# Patient Record
Sex: Female | Born: 1935 | Race: Black or African American | Hispanic: No | Smoking: Never smoker
Health system: Southern US, Community
[De-identification: ages and names within clinical notes are randomized; demographics above are authoritative.]

## PROBLEM LIST (undated history)

## (undated) DIAGNOSIS — I10 Essential (primary) hypertension: Secondary | ICD-10-CM

---

## 2019-07-18 ENCOUNTER — Emergency Department (HOSPITAL_COMMUNITY)
Admission: EM | Admit: 2019-07-18 | Discharge: 2019-07-18 | Disposition: A | Discharge status: Home / Self Care | Payer: Medicare HMO | Attending: Emergency Medicine | Admitting: Emergency Medicine

## 2019-07-18 ENCOUNTER — Encounter (HOSPITAL_COMMUNITY): Payer: Self-pay | Admitting: Emergency Medicine

## 2019-07-18 ENCOUNTER — Other Ambulatory Visit: Payer: Self-pay

## 2019-07-18 ENCOUNTER — Emergency Department (HOSPITAL_COMMUNITY): Payer: Medicare HMO

## 2019-07-18 DIAGNOSIS — M7989 Other specified soft tissue disorders: Secondary | ICD-10-CM | POA: Diagnosis not present

## 2019-07-18 DIAGNOSIS — M79672 Pain in left foot: Secondary | ICD-10-CM | POA: Diagnosis not present

## 2019-07-18 DIAGNOSIS — I10 Essential (primary) hypertension: Secondary | ICD-10-CM | POA: Insufficient documentation

## 2019-07-18 HISTORY — DX: Essential (primary) hypertension: I10

## 2019-07-18 MED ORDER — ACETAMINOPHEN 500 MG PO TABS
1000.0000 mg | ORAL_TABLET | Freq: Once | ORAL | Status: AC
  Administered 2019-07-18: 1000 mg via ORAL
  Filled 2019-07-18: qty 2

## 2019-07-18 MED ORDER — ACETAMINOPHEN 500 MG PO TABS: 1000.0000 mg | ORAL_TABLET | Freq: Four times a day (QID) | ORAL | 0 refills | Status: DC | PRN

## 2019-07-18 NOTE — ED Notes (Signed)
Patient verbalized understanding of discharge instructions. Opportunity for questions were provided. Pt. ambulatory and discharged home.  

## 2019-07-18 NOTE — ED Provider Notes (Signed)
[facility] Provider Note   CSN: [id number] Arrival date & time: 07/18/19  0104    History   Chief Complaint Chief Complaint  Patient presents with  . Foot Pain  . difficulty ambulating    HPI Mary Chavez is a 85 y.o. female with a hx of HTN, GERD presents to the Emergency Department complaining of gradual, persistent, progressively worsening left foot pain onset 3 days ago. She reports pain is worsened with standing and walking.  Patient reports using a walker at home however states it is still difficult to walk due to pain.  She has taken no medications for this.  She does report a history of gout which occurs in the left great toe however symptoms today are different.  Pt reports she fell several months ago and injured the foot, but did not break it.  She [place] being [place] in [place] foot, but she does not know what was the diagnosis was.  Patient denies swelling in her foot or leg.  Calf tenderness, knee or hip pain.  Denies fever, chills, numbness, tingling or weakness.  Patient's daughter at bedside assists with some of the history.  Patient is new to the area and has primary care appointment set up for July 26, 2019.      The history is provided by the patient, a relative and medical records. No language interpreter was used.    Past Medical History:  Diagnosis Date  . Hypertension     There are no active problems to display for this patient.   History reviewed. No pertinent surgical history.   OB History   No obstetric history on file.      Home Medications    Prior to Admission medications   Medication Sig Start Date End Date Taking? Authorizing Provider  acetaminophen (TYLENOL) 500 MG tablet Take 2 tablets (1,000 mg total) by mouth every 6 (six) hours as needed for moderate pain. 07/18/19   Samhita Kretsch, Dahlia ClientHannah, PA-C    Family History No family history on file.  Social History Social History    Tobacco Use  . Smoking status: Never Smoker  Substance Use Topics  . Alcohol use: Not Currently  . Drug use: Not Currently     Allergies   Penicillins   Review of Systems Review of Systems  Constitutional: Negative for chills and fever.  Gastrointestinal: Negative for nausea and vomiting.  Musculoskeletal: Positive for arthralgias. Negative for back pain, joint swelling, neck pain and neck stiffness.  Skin: Negative for wound.  Neurological: Negative for numbness.  Hematological: Does not bruise/bleed easily.  Psychiatric/Behavioral: The patient is not nervous/anxious.   All other systems reviewed and are negative.    Physical Exam Updated Vital Signs BP (!) 141/60 (BP Location: Right Arm)   Pulse 84   Temp 98.3 F (36.8 C)   Resp 17   SpO2 100%   Physical Exam Vitals signs and nursing note reviewed.  Constitutional:      General: She is not in acute distress.    Appearance: She is well-developed. She is not diaphoretic.  HENT:     Head: Normocephalic and atraumatic.  Eyes:     Conjunctiva/sclera: Conjunctivae normal.  Neck:     Musculoskeletal: Normal range of motion.  Cardiovascular:     Rate and Rhythm: Normal rate and regular rhythm.     Pulses:          Dorsalis pedis pulses are 1+ on the right  side and 1+ on the left side.     Comments: Capillary refill < 3 sec in the bilateral lower extremities Weak distal pulses palpable however strong on Doppler. Pulmonary:     Effort: Pulmonary effort is normal.     Breath sounds: Normal breath sounds.  Musculoskeletal:        General: Tenderness present.     Comments: Full range of motion of the bilateral hips, knees, ankles and toes.  Pain to palpation along the plantar fascia of the left foot.  No other focal tenderness.  Skin:    General: Skin is warm and dry.     Comments: No tenting of the skin Skin on the feet is warm without erythema, rash, open wounds or increased warmth.  Neurological:     Mental  Status: She is alert.     Coordination: Coordination normal.     Comments: Sensation intact to normal touch throughout the bilateral lower extremities Strength 5/5 with dorsiflexion and plantarflexion bilaterally.      ED Treatments / Results   Radiology Dg Foot Complete Left  Result Date: 07/18/2019 CLINICAL DATA:  Foot pain and swelling for 2-3 days. Difficulty with bearing weight and ambulation. Fall 1 month prior. EXAM: LEFT FOOT - COMPLETE 3+ VIEW COMPARISON:  None. FINDINGS: The bones are diffusely demineralized which may limit detection of small, nondisplaced fractures. No definite acute or healing fractures. No traumatic malalignment. The there is a marked hallux valgus deformity. Mid and hindfoot alignment is grossly preserved though incompletely assessed on nonweightbearing films. Vascular calcifications are present in the soft tissues. IMPRESSION: 1. No definite acute or healing fractures. 2. Marked hallux valgus deformity. 3. Diffuse bone demineralization. Electronically Signed   By: Lovena Le M.D.   On: 07/18/2019 02:11    Procedures Procedures (including critical care time)  Medications Ordered in ED Medications  acetaminophen (TYLENOL) tablet 1,000 mg (1,000 mg Oral Given 07/18/19 0326)     Initial Impression / Assessment and Plan / ED Course  I have reviewed the triage vital signs and the nursing notes.  Pertinent labs & imaging results that were available during my care of the patient were reviewed by me and considered in my medical decision making (see chart for details).  Clinical Course as of Jul 17 328  Mon Jul 18, 2019  0258 Pt with [facility] appointment on 8/25 Ortho: Dallas Schimke in [place] treating foot after fall several months ago [facility] - appointment on 8/19    [HM]    Clinical Course User Index [HM] Brode Sculley, Jarrett Soho, [place]       Patient presents with left foot pain ongoing for several days.  Clinically most  consistent with plantar fasciitis.  Patient does have a history of arthritis and gout as well however less likely to be the specific cause of patient's pain today.  She has attempted no treatments prior to arrival.  Given age and symptoms will begin treatment with Tylenol for pain.  She does have both a wheelchair and a walker at home which she may use.  I suggested some decreased weightbearing and the importance of maintaining her appointment with podiatry on August 19.  No signs of infection.  X-rays without fracture.  I personally evaluated these images.  Discussed reasons to return immediately to the emergency department.  Patient and daughter state understanding and are in agreement with the plan.  The patient was discussed with Dr. Leonette Monarch who agrees with the treatment plan.  Final Clinical Impressions(s) / ED Diagnoses   Final diagnoses:  Foot pain, left    ED Discharge Orders         Ordered    acetaminophen (TYLENOL) 500 MG tablet  Every 6 hours PRN     07/18/19 0314           Carletta Feasel, Dahlia ClientHannah, PA-C 07/18/19 0329    Cardama, Amadeo GarnetPedro Eduardo, MD 07/18/19 [phone]

## 2019-07-18 NOTE — Discharge Instructions (Addendum)
1. Medications: Tylenol for pain (do not take more than 4 doses in 24 hours); Do not take the expired Tramadol; usual home medications 2. Treatment: rest, drink plenty of fluids, rest, limit walking, elevate foot 3. Follow Up: Please followup with the foot doctor on August 19th for discussion of your diagnoses and further evaluation after today's visit; Please return to the ER for fever, numbness, weakness or other concerns

## 2019-07-18 NOTE — ED Triage Notes (Addendum)
C/o L foot pain and swelling x 2-3 days.  Difficulty bearing weight and ambulating.  Fell 1 month ago.  Family member unsure of history other than HTN.

## 2019-07-25 NOTE — Progress Notes (Signed)
Subjective:    Patient ID: Mary Chavez, female    DOB: Oct 13, 1928, 85 y.o.   MRN: [id number]   CC: new patient   HPI: Ms Skalsky is an 85 yo female presenting to establish care and discuss the following:   Overall doing well, however complains of having "too many medications," chronic urinary incontinence, and steady decrease in appetite over the last several months. Feels like she has to urinate and can't get there fast enough. Denies any leakage with laughing, coughing. Moved here a month ago from [place], [place] to live with her son and daughter-in-law. Enjoys the area thus far.  Care team:  -Foot doctor: [facility] Dr. Barkley Bruns -Eye doctor: 8/31st, Dr. Clifton James  -Did not previously see any other specialist while living in [facility]  PMH:   Hypertension:   Chronic diagnosis   Lisinopril-HCTZ 20-25, HCTZ 25mg , Doxazosin 4mg , hydralazine 25mg  BID  Does not have a blood pressure cuff at home   Denies any headaches, dizziness/lightheadedness, orthostatic hypotension  Hyperlipidemia: Atorvastatin 20mg    GERD:   Chronic, currently asymptomatic   Has been taking Pantoprazole 20mg  for "years"   Denies any MI, stroke, heart failure, liver disease, CKD, or lung disease history    Social history: In a wheelchair recently due hurting when she walking with foot, already seen by Dr. Barkley Bruns for this and currently on steroid taper. On a normal basis she walks with a walker and stable. Does try to stay active walking around the house. Daughter-in-law in the room endorses she has had a gradual reduction in appetite for the past several months, otherwise doing overall well. Has 8 children, 5 are living.  Non-smoker, denies any alcohol or illicit drug use.   Health maintenance: denied flu shot today.   Family history: non-contributory.   Smoking status reviewed-lifetime non-smoker.   Review of Systems Per HPI, also denies recent illness, fever, unexpected weight loss, runny  nose, headache, changes in vision, chest pain, shortness of breath, abdominal pain, N/V/D, weakness/numbness, change in BM, hematochezia, melena, dizziness/lightheadness  +right foot pain  +decreased appetite +incontinence   Patient Active Problem List   Diagnosis Date Noted   Encounter to establish care with new doctor 07/27/2019   Essential hypertension 07/27/2019   GERD (gastroesophageal reflux disease) 07/27/2019   Hyperlipidemia 07/27/2019   Urinary incontinence in female 07/27/2019   Poor appetite 07/27/2019     Objective:  BP 118/61    Pulse 81    Temp 98.5 F (36.9 C) (Oral)    Wt 168 lb (76.2 kg)    SpO2 98%  Vitals and nursing note reviewed  General: NAD, pleasant female sitting comfortably in wheelchair, daughter in room  Cardiac: RRR, normal heart sounds Respiratory: CTAB, normal effort Abdomen: soft, nontender, nondistended Extremities: no LE edema or cyanosis. WWP. Skin: warm and dry, no rashes noted Neuro: alert and oriented, no focal deficits, 5/5 upper and lower extremity strength Psych: normal affect, hard of hearing   Assessment & Plan:   Encounter to establish care with new doctor Discussed and reviewed known past medical/social/surgical history, current medications, and physician care team with patient and daughter in law.  --Made several medication changes as discussed below  --Obtained signed ROR for previous records with PCP in [place]   Essential hypertension BP 118/61 in the office today, asymptomatic. Question if she has had previous resistant hypertension given extensive regimen, however in a patient of this age with associated fall risk, would like to err on the side  of high systolic pressures. Additionally, doxazosin known to precipitate orthostatic hypotension in the elderly (Beer's list) and on HCTZ 50mg  which may be only potentating electrolyte abnormalities rather than providing any further BP control over 25mg .  --Cont Lisinopril-HCTZ  20-25mg   --Cont Hydralazine 25mg  BID  --D/c doxazosin 4mg  and extra HCTZ 25mg   --CMP to evaluate electrolytes/renal function --Follow up in the next 1-2 weeks   GERD (gastroesophageal reflux disease) Chronic, no current symptomatology. Denies having endoscopy in the past. Has been on pantoprazole 20mg  for "years." Would like to transition off of PPI given associated AE including osteoporosis.  --Replace pantoprazole with famotidine 20mg   --Monitor for symptoms   Hyperlipidemia Primary prevention per report. Well tolerated statin for many years. Given she has been able to tolerate this well and may continue to have benefits/prevention of cardiovascular plaques with statin.  --Cont atorvastatin 20mg   --Lipid panel   Urinary incontinence in female Chronic concern. Suspect may be multifactorial in the setting of urge incontinence vs physical barriers, however will discuss further on follow up. Briefly discussed attempting Kegel exercises few times daily and  timed voiding.  --Discuss further on follow up   Poor appetite Chronic over the last several months, associated with decreased ability to take care of herself on her own. Otherwise, doing well. In the meantime, discussed protein shakes such as Ensure or Boost for maintaining nutritional status.  --Encourage eating what she enjoys, adding Ensure/Boost  --Discuss on f/u  --CBC/CMP    Follow up in the next 1-2 weeks to follow BP and discuss additional concerns.   Leticia PennaSamantha Kirin Pastorino DO Family Medicine Resident PGY-2

## 2019-07-26 ENCOUNTER — Encounter: Payer: Self-pay | Admitting: Family Medicine

## 2019-07-26 ENCOUNTER — Ambulatory Visit (INDEPENDENT_AMBULATORY_CARE_PROVIDER_SITE_OTHER): Payer: Medicare HMO | Admitting: Family Medicine

## 2019-07-26 ENCOUNTER — Other Ambulatory Visit: Payer: Self-pay

## 2019-07-26 VITALS — BP 118/61 | HR 81 | Temp 98.5°F | Wt 168.0 lb

## 2019-07-26 DIAGNOSIS — Z7689 Persons encountering health services in other specified circumstances: Secondary | ICD-10-CM

## 2019-07-26 DIAGNOSIS — R630 Anorexia: Secondary | ICD-10-CM

## 2019-07-26 DIAGNOSIS — E785 Hyperlipidemia, unspecified: Secondary | ICD-10-CM

## 2019-07-26 DIAGNOSIS — R32 Unspecified urinary incontinence: Secondary | ICD-10-CM

## 2019-07-26 DIAGNOSIS — K219 Gastro-esophageal reflux disease without esophagitis: Secondary | ICD-10-CM

## 2019-07-26 DIAGNOSIS — I10 Essential (primary) hypertension: Secondary | ICD-10-CM | POA: Diagnosis not present

## 2019-07-26 MED ORDER — FAMOTIDINE 20 MG PO TABS: 20.0000 mg | ORAL_TABLET | Freq: Every day | ORAL | 1 refills | Status: DC

## 2019-07-26 NOTE — Patient Instructions (Addendum)
It was wonderful to meet you today.  We made a lot of medication changes as below.  I would like to see you back in the next 2-3 weeks to monitor your blood pressure and talk about your additional concerns.  I will also call or mail you you within the next week to discuss your lab results.   Medication changes: Stop taking pantoprazole, you will now take famotidine 20 mg daily and replace of this, to help with reflux Stop taking doxazosin and the additional HCTZ.  You may continue taking the atorvastatin if you continue to tolerate this well. Make sure you finish your prednisone taper.

## 2019-07-27 ENCOUNTER — Encounter: Payer: Self-pay | Admitting: Family Medicine

## 2019-07-27 DIAGNOSIS — Z7689 Persons encountering health services in other specified circumstances: Secondary | ICD-10-CM | POA: Insufficient documentation

## 2019-07-27 DIAGNOSIS — E785 Hyperlipidemia, unspecified: Secondary | ICD-10-CM | POA: Insufficient documentation

## 2019-07-27 DIAGNOSIS — K219 Gastro-esophageal reflux disease without esophagitis: Secondary | ICD-10-CM | POA: Insufficient documentation

## 2019-07-27 DIAGNOSIS — R32 Unspecified urinary incontinence: Secondary | ICD-10-CM | POA: Insufficient documentation

## 2019-07-27 DIAGNOSIS — I10 Essential (primary) hypertension: Secondary | ICD-10-CM | POA: Insufficient documentation

## 2019-07-27 DIAGNOSIS — R630 Anorexia: Secondary | ICD-10-CM | POA: Insufficient documentation

## 2019-07-27 LAB — LIPID PANEL
Chol/HDL Ratio: 2.4 ratio (ref 0.0–4.4)
Cholesterol, Total: 131 mg/dL (ref 100–199)
HDL: 55 mg/dL (ref >39)
LDL Calculated: 59 mg/dL (ref 0–99)
Triglycerides: 84 mg/dL (ref 0–149)
VLDL Cholesterol Cal: 17 mg/dL (ref 5–40)

## 2019-07-27 LAB — CBC WITH DIFFERENTIAL/PLATELET
Basophils Absolute: 0 10*3/uL (ref 0.0–0.2)
Basos: 0 %
EOS (ABSOLUTE): 0.1 10*3/uL (ref 0.0–0.4)
Eos: 1 %
Hematocrit: 33.3 % — ABNORMAL LOW (ref 34.0–46.6)
Hemoglobin: 10.4 g/dL — ABNORMAL LOW (ref 11.1–15.9)
Immature Grans (Abs): 0.1 10*3/uL (ref 0.0–0.1)
Immature Granulocytes: 1 %
Lymphocytes Absolute: 3.4 10*3/uL — ABNORMAL HIGH (ref 0.7–3.1)
Lymphs: 25 %
MCH: 24 pg — ABNORMAL LOW (ref 26.6–33.0)
MCHC: 31.2 g/dL — ABNORMAL LOW (ref 31.5–35.7)
MCV: 77 fL — ABNORMAL LOW (ref 79–97)
Monocytes Absolute: 1 10*3/uL — ABNORMAL HIGH (ref 0.1–0.9)
Monocytes: 7 %
Neutrophils Absolute: 9.1 10*3/uL — ABNORMAL HIGH (ref 1.4–7.0)
Neutrophils: 66 %
Platelets: 178 10*3/uL (ref 150–450)
RBC: 4.33 x10E6/uL (ref 3.77–5.28)
RDW: 17.5 % — ABNORMAL HIGH (ref 11.7–15.4)
WBC: 13.8 10*3/uL — ABNORMAL HIGH (ref 3.4–10.8)

## 2019-07-27 LAB — COMPREHENSIVE METABOLIC PANEL
ALT: 25 IU/L (ref 0–32)
AST: 28 IU/L (ref 0–40)
Albumin/Globulin Ratio: 1.2 (ref 1.2–2.2)
Albumin: 3.6 g/dL (ref 3.6–4.6)
Alkaline Phosphatase: 55 IU/L (ref 39–117)
BUN/Creatinine Ratio: 32 — ABNORMAL HIGH (ref 12–28)
BUN: 33 mg/dL — ABNORMAL HIGH (ref 8–27)
Bilirubin Total: 0.3 mg/dL (ref 0.0–1.2)
CO2: 19 mmol/L — ABNORMAL LOW (ref 20–29)
Calcium: 9.3 mg/dL (ref 8.7–10.3)
Chloride: 108 mmol/L — ABNORMAL HIGH (ref 96–106)
Creatinine, Ser: 1.04 mg/dL — ABNORMAL HIGH (ref 0.57–1.00)
GFR calc Af Amer: 55 mL/min/{1.73_m2} — ABNORMAL LOW (ref >59)
GFR calc non Af Amer: 48 mL/min/{1.73_m2} — ABNORMAL LOW (ref >59)
Globulin, Total: 3.1 g/dL (ref 1.5–4.5)
Glucose: 86 mg/dL (ref 65–99)
Potassium: 4.3 mmol/L (ref 3.5–5.2)
Sodium: 143 mmol/L (ref 134–144)
Total Protein: 6.7 g/dL (ref 6.0–8.5)

## 2019-07-27 LAB — TSH: TSH: 2.83 u[IU]/mL (ref 0.450–4.500)

## 2019-07-27 NOTE — Assessment & Plan Note (Addendum)
Chronic over the last several months, associated with decreased ability to take care of herself on her own. Otherwise, doing well. In the meantime, discussed protein shakes such as Ensure or Boost for maintaining nutritional status.  --Encourage eating what she enjoys, adding Ensure/Boost  --Discuss on f/u  --CBC/CMP

## 2019-07-27 NOTE — Assessment & Plan Note (Signed)
Primary prevention per report. Well tolerated statin for many years. Given she has been able to tolerate this well and may continue to have benefits/prevention of cardiovascular plaques with statin.  --Cont atorvastatin 20mg   --Lipid panel

## 2019-07-27 NOTE — Assessment & Plan Note (Signed)
BP 118/61 in the office today, asymptomatic. Question if she has had previous resistant hypertension given extensive regimen, however in a patient of this age with associated fall risk, would like to err on the side of high systolic pressures. Additionally, doxazosin known to precipitate orthostatic hypotension in the elderly (Beer's list) and on HCTZ 50mg  which may be only potentating electrolyte abnormalities rather than providing any further BP control over 25mg .  --Cont Lisinopril-HCTZ 20-25mg   --Cont Hydralazine 25mg  BID  --D/c doxazosin 4mg  and extra HCTZ 25mg   --CMP to evaluate electrolytes/renal function --Follow up in the next 1-2 weeks

## 2019-07-27 NOTE — Assessment & Plan Note (Signed)
Discussed and reviewed known past medical/social/surgical history, current medications, and physician care team with patient and daughter in law.  --Made several medication changes as discussed below  --Obtained signed ROR for previous records with PCP in [place]

## 2019-07-27 NOTE — Assessment & Plan Note (Signed)
Chronic, no current symptomatology. Denies having endoscopy in the past. Has been on pantoprazole 20mg  for "years." Would like to transition off of PPI given associated AE including osteoporosis.  --Replace pantoprazole with famotidine 20mg   --Monitor for symptoms

## 2019-07-27 NOTE — Assessment & Plan Note (Signed)
Chronic concern. Suspect may be multifactorial in the setting of urge incontinence vs physical barriers, however will discuss further on follow up. Briefly discussed attempting Kegel exercises few times daily and  timed voiding.  --Discuss further on follow up

## 2019-07-29 ENCOUNTER — Encounter: Payer: Self-pay | Admitting: Family Medicine

## 2019-08-09 ENCOUNTER — Ambulatory Visit (INDEPENDENT_AMBULATORY_CARE_PROVIDER_SITE_OTHER): Payer: Medicare HMO | Admitting: Family Medicine

## 2019-08-09 ENCOUNTER — Other Ambulatory Visit: Payer: Self-pay

## 2019-08-09 ENCOUNTER — Encounter: Payer: Self-pay | Admitting: Family Medicine

## 2019-08-09 VITALS — BP 125/80 | Wt 167.8 lb

## 2019-08-09 DIAGNOSIS — D509 Iron deficiency anemia, unspecified: Secondary | ICD-10-CM | POA: Diagnosis not present

## 2019-08-09 DIAGNOSIS — I10 Essential (primary) hypertension: Secondary | ICD-10-CM | POA: Diagnosis not present

## 2019-08-09 DIAGNOSIS — R630 Anorexia: Secondary | ICD-10-CM | POA: Diagnosis not present

## 2019-08-09 NOTE — Assessment & Plan Note (Signed)
Hgb 10.4.  Suspect likely in the setting of poor diet, however discussed with daughter-in-law and patient that this could also be secondary to underlying malignancy.  Family does not wish to pursue any further invasive treatment and would not treat if malignancy were to be found, thus will not investigate further. - Repeat CBC with iron panel and ferritin - Likely to send in ferrous sulfate supplementations pending above results, discussed common SE including constipation, darker stools, GI upset (recommended taking with orange juice and meals)

## 2019-08-09 NOTE — Patient Instructions (Signed)
Wonderful to see you both today.  I am going to repeat your labs, action of this next few days.  Pending results, I will likely send in iron supplements.  Additionally, you can use Voltaren gel on her joints and Tylenol 1000 mg 3 times a day.   Continue encourage her staying active, getting good sleep, and socially engaging.  We will continue to watch her appetite and see how her weight trends over the next several months.  Follow-up within the next 2-3 months, or sooner if needed to discuss any additional concerns.  These can be via virtual visit.

## 2019-08-09 NOTE — Assessment & Plan Note (Signed)
Well-controlled, 125/80 in the office today.  Reassured blood pressure continues to be controlled after removing additional HCTZ and doxazosin 2 weeks ago.  Could consider decreasing regimen further on follow-up.   - Continue lisinopril-HCTZ, hydralazine twice daily

## 2019-08-09 NOTE — Progress Notes (Signed)
Subjective:    Patient ID: Mary Chavez, female    DOB: 09/29/1928, 85 y.o.   MRN: [id number]   CC: F/u   HPI: Ms. Mary Chavez is an 85 year old female presenting to discuss the following:  Follow-up blood pressure: Recently stopped her additional HCTZ and doxazosin.  She now currently takes lisinopril-HCTZ 20-25 and hydralazine 25 mg twice daily.  BP 125/80 in the office today.  Denies any headaches, dizziness/lightheadedness, chest pain, presyncope/syncopal episodes.   Appetite: Daughter-in-law in the room and patient expressed a slow trend of decreased appetite over the past several months.  Associated over the last several months with patient being less active and requiring more help to do her ADLs.  Now lives with her daughter-in-law and son and Mary Chavez, moved from [place] recently.  Patient states she just does not feel like eating much, will enjoy the food when she is eating it but then loses her appetite quite quickly.  Does not like drinking milk, boost or Ensure shakes.  Denies any concurrent heartburn, abdominal pain, nausea/vomiting, change in bowel movements.  She gets plenty of sleep at night.  She is often down because her joints and her feet hurt, thinks if those were improved that her mood would be better.  Otherwise, denies feeling depressed.  Weight is stable for now.  Anemia: Discussed finding of microcytic anemia on labs last visit, including the possibilities in this older postmenopausal female of possible underlying malignancy precipitating this anemia vs poor diet etc. Discussed investigating this further, however family and patient requested no further investigation as if anything were to be found, they would not do any treatment for this.  However, they would like to treat this anemia if it is secondary to iron deficiency.   Review of Systems Per HPI   Patient Active Problem List   Diagnosis Date Noted  . Microcytic anemia 08/09/2019  . Encounter to establish care with new  doctor 07/27/2019  . Essential hypertension 07/27/2019  . GERD (gastroesophageal reflux disease) 07/27/2019  . Hyperlipidemia 07/27/2019  . Urinary incontinence in female 07/27/2019  . Poor appetite 07/27/2019     Objective:  BP 125/80   Wt 167 lb 12.8 oz (76.1 kg)  Vitals and nursing note reviewed  General: NAD, pleasant HEENT: Mucous membranes moist. Conjunctival pallor bilaterally. Cardiac: RRR, normal heart sounds, mild 2/6 systolic murmur best heard in 2nd intercostal space   Respiratory: CTAB, normal effort Abdomen: soft, nontender, nondistended Extremities: no edema or cyanosis. WWP. Skin: warm and dry, no rashes noted Neuro: alert and oriented, no focal deficits Psych: normal affect  Assessment & Plan:   Essential hypertension Well-controlled, 125/80 in the office today.  Reassured blood pressure continues to be controlled after removing additional HCTZ and doxazosin 2 weeks ago.  Could consider decreasing regimen further on follow-up.   - Continue lisinopril-HCTZ, hydralazine twice daily  Poor appetite Persistent over the past several months, associated with general decline in ability to perform her own ADLs and activity.  Reassured weight is stable. Additionally, patient noted to have a microcytic anemia on labs during last visit, discussed possibility of underlying malignancy that may contribute to poor appetite.  However, do suspect poor appetite may be in the setting of general aging decline.  Denies any concerning s/sx for depression, however will additionally added pain regimen to boost overall mood as discussed below. - Supportive care, eating food she enjoys/nutritionally dense foods discussed, encouraged hydration - Encourage social interactions, staying active as she can - Tylenol 3000  mg daily (divided) and Voltaren gel on knee joints to improve overall morale given joint pains - Follow-up in approximately 2 months for weight check  Microcytic anemia Hgb 10.4.   Suspect likely in the setting of poor diet, however discussed with daughter-in-law and patient that this could also be secondary to underlying malignancy.  Family does not wish to pursue any further invasive treatment and would not treat if malignancy were to be found, thus will not investigate further. - Repeat CBC with iron panel and ferritin - Likely to send in ferrous sulfate supplementations pending above results, discussed common SE including constipation, darker stools, GI upset (recommended taking with orange juice and meals)   Foot pain: Mild improvement, will be following up with the podiatrist on 9/14.  - Tylenol as needed for pain as discussed above  Follow-up in approximately 2 months or sooner if needed, will check weight at that time as well.  Consider giving Tdap and pneumonia vaccines on follow-up.  Leticia PennaSamantha Carrah Eppolito DO Family Medicine Resident PGY-2

## 2019-08-09 NOTE — Assessment & Plan Note (Addendum)
Persistent over the past several months, associated with general decline in ability to perform her own ADLs and activity.  Reassured weight is stable. Additionally, patient noted to have a microcytic anemia on labs during last visit, discussed possibility of underlying malignancy that may contribute to poor appetite.  However, do suspect poor appetite may be in the setting of general aging decline.  Denies any concerning s/sx for depression, however will additionally added pain regimen to boost overall mood as discussed below. - Supportive care, eating food she enjoys/nutritionally dense foods discussed, encouraged hydration - Encourage social interactions, staying active as she can - Tylenol 3000 mg daily (divided) and Voltaren gel on knee joints to improve overall morale given joint pains - Follow-up in approximately 2 months for weight check

## 2019-08-10 ENCOUNTER — Telehealth: Payer: Self-pay

## 2019-08-10 LAB — CBC WITH DIFFERENTIAL/PLATELET
Basophils Absolute: 0.1 10*3/uL (ref 0.0–0.2)
Basos: 2 %
EOS (ABSOLUTE): 0.6 10*3/uL — ABNORMAL HIGH (ref 0.0–0.4)
Eos: 9 %
Hematocrit: 31.4 % — ABNORMAL LOW (ref 34.0–46.6)
Hemoglobin: 10.3 g/dL — ABNORMAL LOW (ref 11.1–15.9)
Immature Grans (Abs): 0 10*3/uL (ref 0.0–0.1)
Immature Granulocytes: 0 %
Lymphocytes Absolute: 1.6 10*3/uL (ref 0.7–3.1)
Lymphs: 25 %
MCH: 25.1 pg — ABNORMAL LOW (ref 26.6–33.0)
MCHC: 32.8 g/dL (ref 31.5–35.7)
MCV: 77 fL — ABNORMAL LOW (ref 79–97)
Monocytes Absolute: 0.7 10*3/uL (ref 0.1–0.9)
Monocytes: 10 %
Neutrophils Absolute: 3.6 10*3/uL (ref 1.4–7.0)
Neutrophils: 54 %
Platelets: 135 10*3/uL — ABNORMAL LOW (ref 150–450)
RBC: 4.1 x10E6/uL (ref 3.77–5.28)
RDW: 17.5 % — ABNORMAL HIGH (ref 11.7–15.4)
WBC: 6.7 10*3/uL (ref 3.4–10.8)

## 2019-08-10 LAB — IRON,TIBC AND FERRITIN PANEL
Ferritin: 129 ng/mL (ref 15–150)
Iron Saturation: 10 % — ABNORMAL LOW (ref 15–55)
Iron: 28 ug/dL (ref 27–139)
Total Iron Binding Capacity: 282 ug/dL (ref 250–450)
UIBC: 254 ug/dL (ref 118–369)

## 2019-08-10 NOTE — Telephone Encounter (Signed)
Patient calls nurse line stating she thought Mary Chavez was going to send her in iron pills? I informed her I believe you were waiting for the labs to come back, but I will check with provider.

## 2019-08-11 ENCOUNTER — Other Ambulatory Visit: Payer: Self-pay | Admitting: Family Medicine

## 2019-08-11 DIAGNOSIS — D509 Iron deficiency anemia, unspecified: Secondary | ICD-10-CM

## 2019-08-11 MED ORDER — FERROUS SULFATE 325 (65 FE) MG PO TABS: 325.0000 mg | ORAL_TABLET | Freq: Every day | ORAL | 1 refills | Status: DC

## 2019-08-12 NOTE — Telephone Encounter (Signed)
Patient called, ferrous sulfate sent to the pharmacy.    Patriciaann Clan, DO

## 2019-08-24 ENCOUNTER — Emergency Department (HOSPITAL_COMMUNITY)
Admission: EM | Admit: 2019-08-24 | Discharge: 2019-08-24 | Disposition: A | Discharge status: Home / Self Care | Payer: Medicare HMO | Attending: Emergency Medicine | Admitting: Emergency Medicine

## 2019-08-24 ENCOUNTER — Emergency Department (HOSPITAL_COMMUNITY): Payer: Medicare HMO

## 2019-08-24 ENCOUNTER — Encounter (HOSPITAL_COMMUNITY): Payer: Self-pay

## 2019-08-24 ENCOUNTER — Other Ambulatory Visit: Payer: Self-pay

## 2019-08-24 DIAGNOSIS — R630 Anorexia: Secondary | ICD-10-CM | POA: Diagnosis not present

## 2019-08-24 DIAGNOSIS — R531 Weakness: Secondary | ICD-10-CM | POA: Diagnosis present

## 2019-08-24 DIAGNOSIS — R930 Abnormal findings on diagnostic imaging of skull and head, not elsewhere classified: Secondary | ICD-10-CM | POA: Diagnosis not present

## 2019-08-24 DIAGNOSIS — R51 Headache: Secondary | ICD-10-CM | POA: Insufficient documentation

## 2019-08-24 DIAGNOSIS — Z79899 Other long term (current) drug therapy: Secondary | ICD-10-CM | POA: Diagnosis not present

## 2019-08-24 DIAGNOSIS — I10 Essential (primary) hypertension: Secondary | ICD-10-CM | POA: Diagnosis not present

## 2019-08-24 DIAGNOSIS — R778 Other specified abnormalities of plasma proteins: Secondary | ICD-10-CM

## 2019-08-24 DIAGNOSIS — R7989 Other specified abnormal findings of blood chemistry: Secondary | ICD-10-CM | POA: Diagnosis not present

## 2019-08-24 LAB — URINALYSIS, ROUTINE W REFLEX MICROSCOPIC
Glucose, UA: NEGATIVE mg/dL
Hgb urine dipstick: NEGATIVE
Ketones, ur: 5 mg/dL — AB
Nitrite: NEGATIVE
Protein, ur: 30 mg/dL — AB
Specific Gravity, Urine: 1.027 (ref 1.005–1.030)
WBC, UA: >50 WBC/hpf — ABNORMAL HIGH (ref 0–5)
pH: 5 (ref 5.0–8.0)

## 2019-08-24 LAB — BASIC METABOLIC PANEL
Anion gap: 12 (ref 5–15)
BUN: 13 mg/dL (ref 8–23)
CO2: 22 mmol/L (ref 22–32)
Calcium: 9.5 mg/dL (ref 8.9–10.3)
Chloride: 106 mmol/L (ref 98–111)
Creatinine, Ser: 0.94 mg/dL (ref 0.44–1.00)
GFR calc Af Amer: >60 mL/min (ref >60)
GFR calc non Af Amer: 54 mL/min — ABNORMAL LOW (ref >60)
Glucose, Bld: 95 mg/dL (ref 70–99)
Potassium: 3.5 mmol/L (ref 3.5–5.1)
Sodium: 140 mmol/L (ref 135–145)

## 2019-08-24 LAB — CBC
HCT: 37.8 % (ref 36.0–46.0)
Hemoglobin: 11.8 g/dL — ABNORMAL LOW (ref 12.0–15.0)
MCH: 25.4 pg — ABNORMAL LOW (ref 26.0–34.0)
MCHC: 31.2 g/dL (ref 30.0–36.0)
MCV: 81.5 fL (ref 80.0–100.0)
Platelets: 270 10*3/uL (ref 150–400)
RBC: 4.64 MIL/uL (ref 3.87–5.11)
RDW: 20.3 % — ABNORMAL HIGH (ref 11.5–15.5)
WBC: 7.2 10*3/uL (ref 4.0–10.5)
nRBC: 0 % (ref 0.0–0.2)

## 2019-08-24 LAB — TROPONIN I (HIGH SENSITIVITY): Troponin I (High Sensitivity): 20 ng/L — ABNORMAL HIGH (ref <18)

## 2019-08-24 LAB — TSH: TSH: 3.044 u[IU]/mL (ref 0.350–4.500)

## 2019-08-24 LAB — T4, FREE: Free T4: 0.86 ng/dL (ref 0.61–1.12)

## 2019-08-24 MED ORDER — ONDANSETRON 4 MG PO TBDP: 4.0000 mg | ORAL_TABLET | Freq: Three times a day (TID) | ORAL | 0 refills | Status: AC | PRN

## 2019-08-24 MED ORDER — SODIUM CHLORIDE 0.9% FLUSH
3.0000 mL | Freq: Once | INTRAVENOUS | Status: AC
  Administered 2019-08-24: 18:00:00 3 mL via INTRAVENOUS

## 2019-08-24 MED ORDER — SODIUM CHLORIDE 0.9 % IV BOLUS
1000.0000 mL | Freq: Once | INTRAVENOUS | Status: AC
  Administered 2019-08-24: 1000 mL via INTRAVENOUS

## 2019-08-24 MED ORDER — ONDANSETRON HCL 4 MG/2ML IJ SOLN
4.0000 mg | Freq: Once | INTRAMUSCULAR | Status: AC
  Administered 2019-08-24: 4 mg via INTRAVENOUS
  Filled 2019-08-24: qty 2

## 2019-08-24 NOTE — ED Notes (Signed)
Pt given water 

## 2019-08-24 NOTE — ED Triage Notes (Signed)
Patient complains of weakness, dizziness, with headache for the past few days. Family reports minimal intake for same. Alert during triage. Normally seen at [facility]. Patient has not taken daily meds since no intake.

## 2019-08-24 NOTE — ED Provider Notes (Signed)
[facility]   CSN: [id number] Arrival date & time: 08/24/19  1305     History   Chief Complaint Chief Complaint  Patient presents with  . Weakness    HPI Manon Hildingnnie Mae Mcmurtrey is a 85 y.o. female.     85 yo F with a chief complaints of weakness.  Patient has not been eating and drinking well for the past couple weeks.  Family is unsure why.  States she will start eating and then decide that she does not want anymore.  Had seen her family doctor for this at some point.  She also recently was started on iron supplementation for iron deficiency anemia.  Family denies any fevers denies cough denies shortness of breath or chest pain.  Denies abdominal pain vomiting or diarrhea.  Denies dark stool or blood in her stool.  Denies any diet supplements.  She has had a headache off and on that was worse last night and improved throughout today.  The history is provided by the patient.  Weakness Severity:  Moderate Onset quality:  Gradual Duration:  2 weeks Timing:  Constant Progression:  Worsening Chronicity:  New Relieved by:  Nothing Worsened by:  Nothing Ineffective treatments:  None tried Associated symptoms: anorexia   Associated symptoms: no arthralgias, no chest pain, no dizziness, no dysuria, no fever, no headaches, no myalgias, no nausea, no shortness of breath, no urgency and no vomiting     Past Medical History:  Diagnosis Date  . Hypertension     Patient Active Problem List   Diagnosis Date Noted  . Microcytic anemia 08/09/2019  . Encounter to establish care with new doctor 07/27/2019  . Essential hypertension 07/27/2019  . GERD (gastroesophageal reflux disease) 07/27/2019  . Hyperlipidemia 07/27/2019  . Urinary incontinence in female 07/27/2019  . Poor appetite 07/27/2019    History reviewed. No pertinent surgical history.   OB History   No obstetric history on file.      Home Medications    Prior to  Admission medications   Medication Sig Start Date End Date Taking? Authorizing Provider  acetaminophen (TYLENOL) 500 MG tablet Take 2 tablets (1,000 mg total) by mouth every 6 (six) hours as needed for moderate pain. 07/18/19  Yes Muthersbaugh, Dahlia ClientHannah, PA-C  albuterol (VENTOLIN HFA) 108 (90 Base) MCG/ACT inhaler Inhale 1-2 puffs into the lungs every 6 (six) hours as needed for wheezing or shortness of breath.   Yes [provider]  famotidine (PEPCID) 20 MG tablet Take 1 tablet (20 mg total) by mouth daily. 07/26/19  Yes Leticia PennaBeard, Samantha N, DO  ferrous sulfate 325 (65 FE) MG tablet Take 1 tablet (325 mg total) by mouth daily with breakfast. 08/11/19  Yes Leticia PennaBeard, Samantha N, DO  hydrALAZINE (APRESOLINE) 25 MG tablet Take 25 mg by mouth 2 (two) times daily. 04/11/19  Yes [provider]  lisinopril-hydrochlorothiazide (ZESTORETIC) 20-25 MG tablet Take 2 tablets by mouth daily.   Yes [provider]  naproxen sodium (ALEVE) 220 MG tablet Take 220-440 mg by mouth 2 (two) times daily as needed (for pain).   Yes [provider]  atorvastatin (LIPITOR) 20 MG tablet Take 20 mg by mouth daily.    [provider]  clopidogrel (PLAVIX) 75 MG tablet Take 75 mg by mouth daily. 07/16/19   [provider]  doxazosin (CARDURA) 4 MG tablet Take 4 mg by mouth at bedtime.    [provider]  ondansetron (ZOFRAN ODT) 4 MG  disintegrating tablet Take 1 tablet (4 mg total) by mouth every 8 (eight) hours as needed for nausea or vomiting. 08/24/19   Melene Plan, DO  oxybutynin (DITROPAN) 5 MG tablet Take 5 mg by mouth daily.     [provider]  pantoprazole (PROTONIX) 20 MG tablet Take 20 mg by mouth daily before breakfast. 05/02/19   [provider]    Family History No family history on file.  Social History Social History   Tobacco Use  . Smoking status: Never Smoker  . Smokeless tobacco: Never Used  Substance Use Topics  . Alcohol use: Not  Currently  . Drug use: Not Currently     Allergies   Penicillins   Review of Systems Review of Systems  Constitutional: Positive for appetite change. Negative for chills and fever.  HENT: Negative for congestion and rhinorrhea.   Eyes: Negative for redness and visual disturbance.  Respiratory: Negative for shortness of breath and wheezing.   Cardiovascular: Negative for chest pain and palpitations.  Gastrointestinal: Positive for anorexia. Negative for nausea and vomiting.  Genitourinary: Negative for dysuria and urgency.  Musculoskeletal: Negative for arthralgias and myalgias.  Skin: Negative for pallor and wound.  Neurological: Positive for weakness. Negative for dizziness and headaches.     Physical Exam Updated Vital Signs BP (!) 178/76   Pulse (!) 44   Temp 97.6 F (36.4 C) (Oral)   Resp 14   SpO2 100%   Physical Exam Vitals signs and nursing note reviewed.  Constitutional:      General: She is not in acute distress.    Appearance: She is well-developed. She is not diaphoretic.  HENT:     Head: Normocephalic and atraumatic.  Eyes:     Pupils: Pupils are equal, round, and reactive to light.  Neck:     Musculoskeletal: Normal range of motion and neck supple.  Cardiovascular:     Rate and Rhythm: Normal rate and regular rhythm.     Heart sounds: No murmur. No friction rub. No gallop.   Pulmonary:     Effort: Pulmonary effort is normal.     Breath sounds: No wheezing or rales.  Abdominal:     General: There is no distension.     Palpations: Abdomen is soft.     Tenderness: There is no abdominal tenderness.  Musculoskeletal:        General: No tenderness.  Skin:    General: Skin is warm and dry.  Neurological:     Mental Status: She is alert and oriented to person, place, and time.  Psychiatric:        Behavior: Behavior normal.      ED Treatments / Results  Labs (all labs ordered are listed, but only abnormal results are displayed) Labs Reviewed   BASIC METABOLIC PANEL - Abnormal; Notable for the following components:      Result Value   GFR calc non Af Amer 54 (*)    All other components within normal limits  CBC - Abnormal; Notable for the following components:   Hemoglobin 11.8 (*)    MCH 25.4 (*)    RDW 20.3 (*)    All other components within normal limits  URINALYSIS, ROUTINE W REFLEX MICROSCOPIC - Abnormal; Notable for the following components:   Color, Urine AMBER (*)    APPearance CLOUDY (*)    Bilirubin Urine SMALL (*)    Ketones, ur 5 (*)    Protein, ur 30 (*)    Leukocytes,Ua LARGE (*)  WBC, UA >50 (*)    Bacteria, UA MANY (*)    All other components within normal limits  TROPONIN I (HIGH SENSITIVITY) - Abnormal; Notable for the following components:   Troponin I (High Sensitivity) 20 (*)    All other components within normal limits  TSH  T4, FREE  CBG MONITORING, ED    EKG EKG Interpretation  Date/Time:  Wednesday August 24 2019 13:13:40 EDT Ventricular Rate:  78 PR Interval:  206 QRS Duration: 136 QT Interval:  430 QTC Calculation: 490 R Axis:   -61 Text Interpretation:  Normal sinus rhythm Left axis deviation Left ventricular hypertrophy with QRS widening and repolarization abnormality Abnormal ECG No significant change since last tracing Confirmed by Deno Etienne [phone]) on 08/24/2019 5:30:11 PM   Radiology Ct Head Wo Contrast  Result Date: 08/24/2019 CLINICAL DATA:  Altered level of consciousness (LOC), unexplained. Patient reports weakness and dizziness, and headache. EXAM: CT HEAD WITHOUT CONTRAST TECHNIQUE: Contiguous axial images were obtained from the base of the skull through the vertex without intravenous contrast. COMPARISON:  None. FINDINGS: Brain: Age-indeterminate small to moderate cortical infarct in the right temporal lobe, series 3, image 13. No intracranial hemorrhage. Age related atrophy with mild to moderate chronic small vessel ischemia. Bilateral symmetric basal gangliar  mineralization is likely physiologic. No midline shift or mass effect. No hydrocephalus. Vascular: Atherosclerosis of skullbase vasculature without hyperdense vessel or abnormal calcification. Skull: No fracture or focal lesion. Sinuses/Orbits: Mucosal thickening and mucous retention cysts in the left frontal sinus in superior ethmoid air cells. No sinus fluid levels. Bilateral lens extraction. Mastoid air cells are clear. Other: None. IMPRESSION: 1. 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 2. Age related atrophy and chronic small vessel ischemia. Electronically Signed   By: Keith Rake M.D.   On: 08/24/2019 20:17   Dg Chest Port 1 View  Result Date: 08/24/2019 CLINICAL DATA:  Weakness, dizziness, headache EXAM: PORTABLE CHEST 1 VIEW COMPARISON:  None. FINDINGS: Heart is mildly enlarged. No confluent opacities or effusions. Mild hyperinflation. No acute bony abnormality. Degenerative changes in the shoulders. IMPRESSION: Mild hyperinflation.  Mild cardiomegaly.  No active disease. Electronically Signed   By: Rolm Baptise M.D.   On: 08/24/2019 18:15    Procedures Procedures (including critical care time)  Medications Ordered in ED Medications  sodium chloride flush (NS) 0.9 % injection 3 mL (3 mLs Intravenous Given 08/24/19 1813)  sodium chloride 0.9 % bolus 1,000 mL (0 mLs Intravenous Stopped 08/24/19 2106)  ondansetron (ZOFRAN) injection 4 mg (4 mg Intravenous Given 08/24/19 1812)     Initial Impression / Assessment and Plan / ED Course  I have reviewed the triage vital signs and the nursing notes.  Pertinent labs & imaging results that were available during my care of the patient were reviewed by me and considered in my medical decision making (see chart for details).        85 yo F with a chief complaints of generalized weakness after not eating for a couple weeks.  Patient is well-appearing on my exam.  She is able to ambulate independently to use the  bedside commode.  No obvious symptoms.  No obvious findings on exam.  Will obtain a laboratory evaluation give a bolus of IV fluids urine and chest x-ray to screen for infection TSH troponin reassess.  The patient has a mildly elevated troponin at 20.  Her urinalysis appears to be contaminated.  She has no urinary symptoms.  No chest pain  or shortness of breath.  Her CT scan shows a possible subacute stroke.  Her hemoglobin appears to be at her baseline.  I have discussed the results with the patient and family.  I discussed that typically this imaging abnormality on the CT scan would require further imaging with an MRI.  I also discussed that a positive troponin of unknown etiology usually keeps the patient in the hospital to ensure they are not having a heart attack or other serious pathology.  The patient and family understand this and they would prefer to go home at this time.  They will follow-up with her family doctor.  I offered for them to return at any time.  They understand the risks that this could be a stroke and she could have a worsening stroke or complication that could leave her apparently disabled for be fatal.  She will follow-up with her doctor in the office.  9:14 PM:  I have discussed the diagnosis/risks/treatment options with the patient and family and believe the pt to be eligible for discharge home to follow-up with PCP. We also discussed returning to the ED immediately if new or worsening sx occur. We discussed the sx which are most concerning (e.g.,stoke s/sx, chest pain, sob. ) that necessitate immediate return. Medications administered to the patient during their visit and any new prescriptions provided to the patient are listed below.  Medications given during this visit Medications  sodium chloride flush (NS) 0.9 % injection 3 mL (3 mLs Intravenous Given 08/24/19 1813)  sodium chloride 0.9 % bolus 1,000 mL (0 mLs Intravenous Stopped 08/24/19 2106)  ondansetron (ZOFRAN) injection  4 mg (4 mg Intravenous Given 08/24/19 1812)     The patient appears reasonably screen and/or stabilized for discharge and I doubt any other medical condition or other [facility] requiring further screening, evaluation, or treatment in the ED at this time prior to discharge.    Final Clinical Impressions(s) / ED Diagnoses   Final diagnoses:  Troponin level elevated  Abnormal CT of the head    ED Discharge Orders         Ordered    ondansetron (ZOFRAN ODT) 4 MG disintegrating tablet  Every 8 hours PRN     08/24/19 2111           Melene Plan, DO 08/24/19 2114

## 2019-08-24 NOTE — ED Notes (Signed)
Patient transported to CT 

## 2019-08-24 NOTE — Discharge Instructions (Addendum)
Your head CT showed that you may have had a stroke.  This sometimes would have you come in to the hospital for physical therapy and try and see if there is something we can prevent to keep you from having another one.  Please follow-up with your family doctor in the office and discussed this with them.  You also had a blood level that showed that you had signs of heart damage.  I am not sure of the significance of this.  Typically this would be something else that may keep you in the hospital and have them watch you and make sure that you were not having a heart attack.  As you are not having chest pain or trouble breathing I think this is less likely.  Please discuss this with them as well.  Discussed with them that you have been having trouble eating and see if they think the iron supplementation may be the cause of it.  Please return if you change your mind and would like to be worked up further or if your symptoms worsen.

## 2019-08-31 ENCOUNTER — Ambulatory Visit: Payer: Medicare HMO | Admitting: Family Medicine

## 2019-09-01 ENCOUNTER — Other Ambulatory Visit: Payer: Self-pay

## 2019-09-01 ENCOUNTER — Ambulatory Visit (INDEPENDENT_AMBULATORY_CARE_PROVIDER_SITE_OTHER): Payer: Medicare HMO | Admitting: Family Medicine

## 2019-09-01 ENCOUNTER — Encounter: Payer: Self-pay | Admitting: Family Medicine

## 2019-09-01 ENCOUNTER — Other Ambulatory Visit: Payer: Self-pay | Admitting: *Deleted

## 2019-09-01 VITALS — BP 124/60 | HR 58 | Wt 165.8 lb

## 2019-09-01 DIAGNOSIS — I639 Cerebral infarction, unspecified: Secondary | ICD-10-CM

## 2019-09-01 DIAGNOSIS — R630 Anorexia: Secondary | ICD-10-CM

## 2019-09-01 DIAGNOSIS — K219 Gastro-esophageal reflux disease without esophagitis: Secondary | ICD-10-CM

## 2019-09-01 MED ORDER — MIRTAZAPINE 15 MG PO TABS: 15.0000 mg | ORAL_TABLET | Freq: Every day | ORAL | 0 refills | Status: DC

## 2019-09-01 NOTE — Patient Instructions (Signed)
It was a pleasure to see you today! Thank you for choosing [facility] for your primary care. Mary Chavez was seen for emergency department follow-up and loss of appetite. Come back to the clinic to see Dr. Higinio Plan after get the medication list from her pharmacy.  Today we talked about your recent emergency department visit.  We are glad that you not having any symptoms from the prior stroke that was shown on CT.  It is difficult to come up with a long-term plan without having an accurate medication list and it seems like there is some confusion about what you taking at home.  Please go to your pharmacy get this list, and take it back to Dr. Higinio Plan.  Likely do not seem to be having any heart symptoms and that your activity tolerance has a particular change with no chest pain or shortness of breath.  If any of these happen please go back to the emergency department immediately.  On a long-term basis we need to work on your appetite and to make sure that you maintain your muscle mass through exercise and eating.  We are going to start this Remeron at a low dose which should help with your appetite a little bit.  Were also going to try to schedule you with our senior citizen medicine specialist who is going to try and help you come up with a good plan going forward.   Please bring all your medications to every doctors visit   Sign up for My Chart to have easy access to your labs results, and communication with your Primary care physician.     Please check-out at the front desk before leaving the clinic.     Best,  Dr. Sherene Sires FAMILY MEDICINE RESIDENT - PGY3 09/01/2019 10:04 AM

## 2019-09-01 NOTE — Progress Notes (Signed)
    Subjective:  Mary Chavez is a 85 y.o. female who presents to the [facility] today with a chief complaint of emergency department follow-up for headaches/age-indeterminate stroke on CT.   HPI: Poor appetite Patient still complaining of lack of appetite, we discussed thoroughly with family that adequate nutrition is required for her to be able to live and maintain her strength.  She is having decreased energy level to be able to stand with confidence.  Charted is down 3 pounds in the last few months.  Lives with family who seems very appropriate and attentive.  Would not do most protein shakes because she does not like the taste of milk.  We discussed potentially trying water as a base   Stroke [facility]) Age-indeterminate stroke found on CT September 2020.  During work-up in emergency department for headache.  No focal neurological deficits at that time or since.  Also with chronic atrophy.   Objective:  Physical Exam: BP 124/60   Pulse (!) 58   Wt 165 lb 12.8 oz (75.2 kg)   SpO2 98%   Gen: NAD, conversing comfortably CV: RRR with does have 2/6 murmur Pulm: NWOB, CTAB with no crackles, wheezes, or rhonchi MSK: no edema, cyanosis, or clubbing noted, uses walker Skin: warm, dry Neuro: grossly normal, moves all extremities Psych: Normal affect and thought content,   No results found for this or any previous visit (from the past 72 hour(s)).   Assessment/Plan:  Stroke [facility]) Age-indeterminate stroke found on CT September 2020.  During work-up in emergency department for headache.  No focal neurological deficits at that time or since.  Also with chronic atrophy.  Poor appetite Patient still complaining of lack of appetite, we discussed thoroughly with family that adequate nutrition is required for her to be able to live and maintain her strength.  She is having decreased energy level to be able to stand with confidence.  Charted is down 3 pounds in the last few months  Start Remeron to  hopefully help with appetite Advised to use standing from chair as an exercise point I do multiple times per day as she can tolerate. Will refer to geriatric clinic     Sherene Sires, [place] - PGY3 09/02/2019 9:46 AM

## 2019-09-02 DIAGNOSIS — R630 Anorexia: Secondary | ICD-10-CM | POA: Insufficient documentation

## 2019-09-02 DIAGNOSIS — I639 Cerebral infarction, unspecified: Secondary | ICD-10-CM | POA: Insufficient documentation

## 2019-09-02 MED ORDER — FAMOTIDINE 20 MG PO TABS: 20.0000 mg | ORAL_TABLET | Freq: Every day | ORAL | 3 refills | Status: AC

## 2019-09-02 NOTE — Assessment & Plan Note (Signed)
Age-indeterminate stroke found on CT September 2020.  During work-up in emergency department for headache.  No focal neurological deficits at that time or since.  Also with chronic atrophy.

## 2019-09-02 NOTE — Assessment & Plan Note (Signed)
Patient still complaining of lack of appetite, we discussed thoroughly with family that adequate nutrition is required for her to be able to live and maintain her strength.  She is having decreased energy level to be able to stand with confidence.  Charted is down 3 pounds in the last few months  Start Remeron to hopefully help with appetite Advised to use standing from chair as an exercise point I do multiple times per day as she can tolerate. Will refer to geriatric clinic

## 2019-09-12 ENCOUNTER — Observation Stay (HOSPITAL_COMMUNITY): Payer: Medicare HMO

## 2019-09-12 ENCOUNTER — Emergency Department (HOSPITAL_COMMUNITY): Payer: Medicare HMO

## 2019-09-12 ENCOUNTER — Encounter (HOSPITAL_COMMUNITY): Payer: Self-pay

## 2019-09-12 ENCOUNTER — Other Ambulatory Visit: Payer: Self-pay

## 2019-09-12 ENCOUNTER — Inpatient Hospital Stay (HOSPITAL_COMMUNITY)
Admission: EM | Admit: 2019-09-12 | Discharge: 2019-09-23 | DRG: 178 | Disposition: A | Discharge status: Skilled Nursing Facility | Payer: Medicare HMO | Attending: Family Medicine | Admitting: Family Medicine

## 2019-09-12 ENCOUNTER — Ambulatory Visit: Payer: Medicare HMO | Admitting: Family Medicine

## 2019-09-12 DIAGNOSIS — Z20828 Contact with and (suspected) exposure to other viral communicable diseases: Secondary | ICD-10-CM | POA: Diagnosis present

## 2019-09-12 DIAGNOSIS — Z791 Long term (current) use of non-steroidal anti-inflammatories (NSAID): Secondary | ICD-10-CM

## 2019-09-12 DIAGNOSIS — M25579 Pain in unspecified ankle and joints of unspecified foot: Secondary | ICD-10-CM

## 2019-09-12 DIAGNOSIS — R7989 Other specified abnormal findings of blood chemistry: Secondary | ICD-10-CM

## 2019-09-12 DIAGNOSIS — E876 Hypokalemia: Secondary | ICD-10-CM | POA: Diagnosis not present

## 2019-09-12 DIAGNOSIS — N390 Urinary tract infection, site not specified: Secondary | ICD-10-CM | POA: Diagnosis not present

## 2019-09-12 DIAGNOSIS — R4182 Altered mental status, unspecified: Secondary | ICD-10-CM | POA: Diagnosis not present

## 2019-09-12 DIAGNOSIS — I4891 Unspecified atrial fibrillation: Secondary | ICD-10-CM | POA: Diagnosis not present

## 2019-09-12 DIAGNOSIS — Z7902 Long term (current) use of antithrombotics/antiplatelets: Secondary | ICD-10-CM

## 2019-09-12 DIAGNOSIS — R441 Visual hallucinations: Secondary | ICD-10-CM

## 2019-09-12 DIAGNOSIS — N179 Acute kidney failure, unspecified: Secondary | ICD-10-CM

## 2019-09-12 DIAGNOSIS — R64 Cachexia: Secondary | ICD-10-CM

## 2019-09-12 DIAGNOSIS — R509 Fever, unspecified: Secondary | ICD-10-CM

## 2019-09-12 DIAGNOSIS — R32 Unspecified urinary incontinence: Secondary | ICD-10-CM | POA: Diagnosis present

## 2019-09-12 DIAGNOSIS — J690 Pneumonitis due to inhalation of food and vomit: Secondary | ICD-10-CM | POA: Diagnosis not present

## 2019-09-12 DIAGNOSIS — Z79899 Other long term (current) drug therapy: Secondary | ICD-10-CM

## 2019-09-12 DIAGNOSIS — Z8673 Personal history of transient ischemic attack (TIA), and cerebral infarction without residual deficits: Secondary | ICD-10-CM

## 2019-09-12 DIAGNOSIS — E785 Hyperlipidemia, unspecified: Secondary | ICD-10-CM | POA: Diagnosis present

## 2019-09-12 DIAGNOSIS — I119 Hypertensive heart disease without heart failure: Secondary | ICD-10-CM | POA: Diagnosis present

## 2019-09-12 DIAGNOSIS — R4781 Slurred speech: Secondary | ICD-10-CM | POA: Diagnosis not present

## 2019-09-12 DIAGNOSIS — K219 Gastro-esophageal reflux disease without esophagitis: Secondary | ICD-10-CM | POA: Diagnosis present

## 2019-09-12 DIAGNOSIS — M109 Gout, unspecified: Secondary | ICD-10-CM | POA: Diagnosis present

## 2019-09-12 DIAGNOSIS — Z880 Allergy status to penicillin: Secondary | ICD-10-CM

## 2019-09-12 DIAGNOSIS — I482 Chronic atrial fibrillation, unspecified: Secondary | ICD-10-CM | POA: Diagnosis present

## 2019-09-12 DIAGNOSIS — F0390 Unspecified dementia without behavioral disturbance: Secondary | ICD-10-CM | POA: Diagnosis present

## 2019-09-12 DIAGNOSIS — R778 Other specified abnormalities of plasma proteins: Secondary | ICD-10-CM

## 2019-09-12 DIAGNOSIS — R410 Disorientation, unspecified: Secondary | ICD-10-CM

## 2019-09-12 DIAGNOSIS — Z9181 History of falling: Secondary | ICD-10-CM

## 2019-09-12 DIAGNOSIS — I440 Atrioventricular block, first degree: Secondary | ICD-10-CM | POA: Diagnosis not present

## 2019-09-12 DIAGNOSIS — F05 Delirium due to known physiological condition: Secondary | ICD-10-CM

## 2019-09-12 DIAGNOSIS — J189 Pneumonia, unspecified organism: Secondary | ICD-10-CM | POA: Diagnosis present

## 2019-09-12 DIAGNOSIS — R451 Restlessness and agitation: Secondary | ICD-10-CM | POA: Diagnosis not present

## 2019-09-12 DIAGNOSIS — B9620 Unspecified Escherichia coli [E. coli] as the cause of diseases classified elsewhere: Secondary | ICD-10-CM | POA: Diagnosis present

## 2019-09-12 LAB — URINALYSIS, ROUTINE W REFLEX MICROSCOPIC
Bilirubin Urine: NEGATIVE
Glucose, UA: NEGATIVE mg/dL
Hgb urine dipstick: NEGATIVE
Ketones, ur: NEGATIVE mg/dL
Nitrite: POSITIVE — AB
Protein, ur: 30 mg/dL — AB
Specific Gravity, Urine: 1.021 (ref 1.005–1.030)
pH: 6 (ref 5.0–8.0)

## 2019-09-12 LAB — COMPREHENSIVE METABOLIC PANEL
ALT: 12 U/L (ref 0–44)
ALT: 12 U/L (ref 0–44)
AST: 25 U/L (ref 15–41)
AST: 27 U/L (ref 15–41)
Albumin: 3.7 g/dL (ref 3.5–5.0)
Albumin: 3.5 g/dL (ref 3.5–5.0)
Alkaline Phosphatase: 57 U/L (ref 38–126)
Alkaline Phosphatase: 52 U/L (ref 38–126)
Anion gap: 12 (ref 5–15)
Anion gap: 13 (ref 5–15)
BUN: 23 mg/dL (ref 8–23)
BUN: 22 mg/dL (ref 8–23)
CO2: 24 mmol/L (ref 22–32)
CO2: 24 mmol/L (ref 22–32)
Calcium: 9.9 mg/dL (ref 8.9–10.3)
Calcium: 9.8 mg/dL (ref 8.9–10.3)
Chloride: 106 mmol/L (ref 98–111)
Chloride: 106 mmol/L (ref 98–111)
Creatinine, Ser: 1.39 mg/dL — ABNORMAL HIGH (ref 0.44–1.00)
Creatinine, Ser: 1.18 mg/dL — ABNORMAL HIGH (ref 0.44–1.00)
GFR calc Af Amer: 39 mL/min — ABNORMAL LOW (ref >60)
GFR calc Af Amer: 47 mL/min — ABNORMAL LOW (ref >60)
GFR calc non Af Amer: 34 mL/min — ABNORMAL LOW (ref >60)
GFR calc non Af Amer: 41 mL/min — ABNORMAL LOW (ref >60)
Glucose, Bld: 113 mg/dL — ABNORMAL HIGH (ref 70–99)
Glucose, Bld: 113 mg/dL — ABNORMAL HIGH (ref 70–99)
Potassium: 3.8 mmol/L (ref 3.5–5.1)
Potassium: 3.3 mmol/L — ABNORMAL LOW (ref 3.5–5.1)
Sodium: 142 mmol/L (ref 135–145)
Sodium: 143 mmol/L (ref 135–145)
Total Bilirubin: 0.9 mg/dL (ref 0.3–1.2)
Total Bilirubin: 0.9 mg/dL (ref 0.3–1.2)
Total Protein: 7.9 g/dL (ref 6.5–8.1)
Total Protein: 7.3 g/dL (ref 6.5–8.1)

## 2019-09-12 LAB — RAPID URINE DRUG SCREEN, HOSP PERFORMED
Amphetamines: NOT DETECTED
Barbiturates: NOT DETECTED
Benzodiazepines: NOT DETECTED
Cocaine: NOT DETECTED
Opiates: NOT DETECTED
Tetrahydrocannabinol: NOT DETECTED

## 2019-09-12 LAB — CBC
HCT: 39.2 % (ref 36.0–46.0)
Hemoglobin: 12.2 g/dL (ref 12.0–15.0)
MCH: 26 pg (ref 26.0–34.0)
MCHC: 31.1 g/dL (ref 30.0–36.0)
MCV: 83.4 fL (ref 80.0–100.0)
Platelets: 219 10*3/uL (ref 150–400)
RBC: 4.7 MIL/uL (ref 3.87–5.11)
RDW: 18.1 % — ABNORMAL HIGH (ref 11.5–15.5)
WBC: 8.5 10*3/uL (ref 4.0–10.5)
nRBC: 0 % (ref 0.0–0.2)

## 2019-09-12 LAB — CBC WITH DIFFERENTIAL/PLATELET
Abs Immature Granulocytes: 0.02 10*3/uL (ref 0.00–0.07)
Basophils Absolute: 0.1 10*3/uL (ref 0.0–0.1)
Basophils Relative: 2 %
Eosinophils Absolute: 0.2 10*3/uL (ref 0.0–0.5)
Eosinophils Relative: 3 %
HCT: 40.5 % (ref 36.0–46.0)
Hemoglobin: 12.9 g/dL (ref 12.0–15.0)
Immature Granulocytes: 0 %
Lymphocytes Relative: 36 %
Lymphs Abs: 2.9 10*3/uL (ref 0.7–4.0)
MCH: 26.5 pg (ref 26.0–34.0)
MCHC: 31.9 g/dL (ref 30.0–36.0)
MCV: 83.3 fL (ref 80.0–100.0)
Monocytes Absolute: 0.6 10*3/uL (ref 0.1–1.0)
Monocytes Relative: 8 %
Neutro Abs: 4.1 10*3/uL (ref 1.7–7.7)
Neutrophils Relative %: 51 %
Platelets: 193 10*3/uL (ref 150–400)
RBC: 4.86 MIL/uL (ref 3.87–5.11)
RDW: 18 % — ABNORMAL HIGH (ref 11.5–15.5)
WBC: 8 10*3/uL (ref 4.0–10.5)
nRBC: 0 % (ref 0.0–0.2)

## 2019-09-12 LAB — LACTIC ACID, PLASMA
Lactic Acid, Venous: 1.4 mmol/L (ref 0.5–1.9)
Lactic Acid, Venous: 1.5 mmol/L (ref 0.5–1.9)

## 2019-09-12 LAB — ETHANOL: Alcohol, Ethyl (B): <10 mg/dL (ref <10)

## 2019-09-12 LAB — HEMOGLOBIN A1C
Hgb A1c MFr Bld: 5.2 % (ref 4.8–5.6)
Mean Plasma Glucose: 102.54 mg/dL

## 2019-09-12 LAB — SARS CORONAVIRUS 2 (TAT 6-24 HRS): SARS Coronavirus 2: NEGATIVE

## 2019-09-12 LAB — TROPONIN I (HIGH SENSITIVITY)
Troponin I (High Sensitivity): 51 ng/L — ABNORMAL HIGH (ref <18)
Troponin I (High Sensitivity): 46 ng/L — ABNORMAL HIGH (ref <18)

## 2019-09-12 LAB — AMMONIA: Ammonia: 26 umol/L (ref 9–35)

## 2019-09-12 LAB — TSH: TSH: 3.799 u[IU]/mL (ref 0.350–4.500)

## 2019-09-12 MED ORDER — SODIUM CHLORIDE 0.9 % IV SOLN
1.0000 g | Freq: Once | INTRAVENOUS | Status: AC
  Administered 2019-09-12: 1 g via INTRAVENOUS
  Filled 2019-09-12: qty 10

## 2019-09-12 MED ORDER — SODIUM CHLORIDE 0.9 % IV SOLN
1.0000 g | INTRAVENOUS | Status: AC
  Administered 2019-09-13 – 2019-09-14 (×2): 1 g via INTRAVENOUS
  Filled 2019-09-12 (×2): qty 10

## 2019-09-12 MED ORDER — HEPARIN SODIUM (PORCINE) 5000 UNIT/ML IJ SOLN
5000.0000 [IU] | Freq: Three times a day (TID) | INTRAMUSCULAR | Status: AC
  Administered 2019-09-12 – 2019-09-16 (×13): 5000 [IU] via SUBCUTANEOUS
  Filled 2019-09-12 (×14): qty 1

## 2019-09-12 MED ORDER — HYDRALAZINE HCL 25 MG PO TABS: 25.0000 mg | ORAL_TABLET | Freq: Three times a day (TID) | ORAL | Status: DC

## 2019-09-12 MED ORDER — SODIUM CHLORIDE 0.9 % IV SOLN
INTRAVENOUS | Status: DC
  Administered 2019-09-12 – 2019-09-17 (×6): via INTRAVENOUS

## 2019-09-12 MED ORDER — SODIUM CHLORIDE 0.9 % IV SOLN
500.0000 mg | Freq: Once | INTRAVENOUS | Status: AC
  Administered 2019-09-12: 500 mg via INTRAVENOUS
  Filled 2019-09-12: qty 500

## 2019-09-12 MED ORDER — ALBUTEROL SULFATE (2.5 MG/3ML) 0.083% IN NEBU: 3.0000 mL | INHALATION_SOLUTION | Freq: Four times a day (QID) | RESPIRATORY_TRACT | Status: DC | PRN

## 2019-09-12 MED ORDER — SODIUM CHLORIDE 0.9 % IV SOLN: 1.0000 g | INTRAVENOUS | Status: DC

## 2019-09-12 NOTE — ED Triage Notes (Signed)
Pt arrives via [facility] Per family pt has been altered since 30 on 10/11. Per family pt was unable to appropriately recognize son, and was having visual hallucinations. Pt has a hx of HTN and CVA with residual slurred speach

## 2019-09-12 NOTE — Evaluation (Signed)
Clinical/Bedside Swallow Evaluation Patient Details  Name: Mary Chavez MRN: [id number] Date of Birth: 1928-06-24  Today's Date: 09/12/2019 Time: SLP Start Time (ACUTE ONLY): 3557 SLP Stop Time (ACUTE ONLY): 1253 SLP Time Calculation (min) (ACUTE ONLY): 18 min  Past Medical History:  Past Medical History:  Diagnosis Date  . Hypertension    Past Surgical History: History reviewed. No pertinent surgical history. HPI:  Pt is a 85 Y/O F with PMX of  HTN, HLD, CVA brought in by the family for change in mental status and Hallucination, which started a few hours before presentation. Denies LOC, no fall, no facial asymmetry, no limb weakness.  Head CT and Brain MRI were clear for acute abnormalities, but revealed "chronic encephalomalacia" and "generalized brain atrophy".  CXR on 09/12/19 reported: "Findings suspicious for right middle lobe consolidation, possibly pneumonia".  No documented dysphagia hx.    Assessment / Plan / Recommendation Clinical Impression  Pt presents with mild-moderate oral dysphagia and suspected pharyngeal dysphagia.  Pt was encountered awake/alert and she was pleasant, but confused throughout this evaluation.  She consumed trials of ice chips, thin liquid, puree, and regular solids.  Pt presented with good bolus acceptance with all trials and she was able to feed herself given set up and minimal assistance.  Pt exhibited a delayed throat clear following 1/6 trials of thin liquid via cup sip, and she exhibited a delayed cough following 1/3 trials of thin liquid via straw sip.  Suspect delayed swallow initiation and possibly reduced hyolaryngeal elevation and excursion per observation and palpation.   No additional clinical s/sx of aspiration were observed with liquid trials despite challenging.  Pt additionally benefited from verbal cues to maintain attention to the bolus and to discourage attempts to converse during po consumption.  Pt with significantly prolonged mastication  of regular solids and subsequent mild oral residue on the L lingual surface that required multiple liquid washes to clear.  Pt exhibited timely AP transport and no oral residue during puree trials.  Recommend initiation of Dysphagia 2 (fine chop) solids and thin liquids with the following precautions: 1) NO STRAWS 2) Small bites/sips 3) Slow rate of intake 4) Sit upright 90 degrees.  Additionally recommend that medications be administered crushed in puree.  Pt may also benefit from a cognitive-linguistic evaluation.  ST will follow to monitor diet tolerance and to further evaluate swallow function per POC.   SLP Visit Diagnosis: Dysphagia, unspecified (R13.10)    Aspiration Risk  Mild aspiration risk    Diet Recommendation Dysphagia 2 (Fine chop);Thin liquid   Liquid Administration via: Cup;No straw Medication Administration: Crushed with puree Supervision: Staff to assist with self feeding;Full supervision/cueing for compensatory strategies Compensations: Minimize environmental distractions;Slow rate;Small sips/bites Postural Changes: Seated upright at 90 degrees    Other  Recommendations Oral Care Recommendations: Oral care BID   Follow up Recommendations 24 hour supervision/assistance;Skilled Nursing facility      Frequency and Duration min 2x/week  2 weeks       Prognosis Prognosis for Safe Diet Advancement: Fair Barriers to Reach Goals: Cognitive deficits      Swallow Study   General HPI: Pt is a 85 Y/O F with PMX of  HTN, HLD, CVA brought in by the family for change in mental status and Hallucination, which started a few hours before presentation. Denies LOC, no fall, no facial asymmetry, no limb weakness.  Head CT and Brain MRI were clear for acute abnormalities, but revealed "chronic encephalomalacia" and "generalized brain atrophy".  CXR on 10/12 reported: "Findings suspicious for right middle lobe consolidation, possibly pneumonia".  No documented dysphagia hx.  Type of  Study: Bedside Swallow Evaluation Previous Swallow Assessment: None Diet Prior to this Study: NPO Temperature Spikes Noted: No Respiratory Status: Room air History of Recent Intubation: No Behavior/Cognition: Alert;Cooperative;Confused;Distractible;Requires cueing Oral Cavity Assessment: Within Functional Limits Oral Care Completed by SLP: No Oral Cavity - Dentition: Missing dentition(Edentulous top and missing dentition on bottom ) Vision: Functional for self-feeding Self-Feeding Abilities: Needs set up;Needs assist Patient Positioning: Upright in bed Baseline Vocal Quality: Low vocal intensity Volitional Swallow: Able to elicit    Oral/Motor/Sensory Function Overall Oral Motor/Sensory Function: Mild impairment Facial Symmetry: Within Functional Limits Facial Sensation: Within Functional Limits Lingual ROM: Within Functional Limits Lingual Symmetry: Within Functional Limits Lingual Strength: Reduced   Ice Chips Ice chips: Within functional limits Presentation: Spoon   Thin Liquid Thin Liquid: Impaired Presentation: Cup;Straw;Self Fed;Spoon Pharyngeal  Phase Impairments: Suspected delayed Swallow;Cough - Delayed;Throat Clearing - Delayed    Nectar Thick Nectar Thick Liquid: Not tested   Honey Thick Honey Thick Liquid: Not tested   Puree Puree: Within functional limits Presentation: Spoon   Solid     Solid: Impaired Presentation: Self Fed Oral Phase Impairments: Reduced lingual movement/coordination;Impaired mastication Oral Phase Functional Implications: Prolonged oral transit;Impaired mastication;Oral residue     Mary Chavez, M.S., CCC-SLP Acute Rehabilitation Services Office: [phone]  Mary Chavez Mary Chavez 09/12/2019,1:18 PM

## 2019-09-12 NOTE — ED Provider Notes (Signed)
[facility] [facility]   CSN: [id number] Arrival date & time: 09/12/19  0002    History   Chief Complaint Chief Complaint  Patient presents with  . Altered Mental Status    HPI Mary Chavez is a 85 y.o. female.   The history is provided by the patient and a relative.  She has history of hypertension, hyperlipidemia, stroke, GERD and is brought in by family because of hallucinations.  Apparently, she woke up at about 3 AM and has been awake since then.  During that time, she has been seeing people who are not there.  Family is also noted that her speech is more slurred than normal.  She has not had any recent falls.  They deny any fever or cough or vomiting or diarrhea.  She had been started on mirtazapine about 10 days ago (given for as an appetite stimulant).  Past Medical History:  Diagnosis Date  . Hypertension     Patient Active Problem List   Diagnosis Date Noted  . Stroke (HCC) 09/02/2019  . Microcytic anemia 08/09/2019  . Encounter to establish care with new doctor 07/27/2019  . Essential hypertension 07/27/2019  . GERD (gastroesophageal reflux disease) 07/27/2019  . Hyperlipidemia 07/27/2019  . Urinary incontinence in female 07/27/2019  . Poor appetite 07/27/2019    History reviewed. No pertinent surgical history.   OB History   No obstetric history on file.      Home Medications    Prior to Admission medications   Medication Sig Start Date End Date Taking? Authorizing Provider  acetaminophen (TYLENOL) 500 MG tablet Take 2 tablets (1,000 mg total) by mouth every 6 (six) hours as needed for moderate pain. 07/18/19   Muthersbaugh, Dahlia Client, PA-C  albuterol (VENTOLIN HFA) 108 (90 Base) MCG/ACT inhaler Inhale 1-2 puffs into the lungs every 6 (six) hours as needed for wheezing or shortness of breath.    [provider]  atorvastatin (LIPITOR) 20 MG tablet Take 20 mg by mouth daily.    [provider]   clopidogrel (PLAVIX) 75 MG tablet Take 75 mg by mouth daily. 07/16/19   [provider]  doxazosin (CARDURA) 4 MG tablet Take 4 mg by mouth at bedtime.    [provider]  famotidine (PEPCID) 20 MG tablet Take 1 tablet (20 mg total) by mouth daily. 09/02/19   Allayne Stack, DO  hydrALAZINE (APRESOLINE) 25 MG tablet Take 25 mg by mouth 2 (two) times daily. 04/11/19   [provider]  lisinopril-hydrochlorothiazide (ZESTORETIC) 20-25 MG tablet Take 2 tablets by mouth daily.    [provider]  mirtazapine (REMERON) 15 MG tablet Take 1 tablet (15 mg total) by mouth at bedtime. 09/01/19   Marthenia Rolling, DO  ondansetron (ZOFRAN ODT) 4 MG disintegrating tablet Take 1 tablet (4 mg total) by mouth every 8 (eight) hours as needed for nausea or vomiting. 08/24/19   Melene Plan, DO  oxybutynin (DITROPAN) 5 MG tablet Take 5 mg by mouth daily.     [provider]  pantoprazole (PROTONIX) 20 MG tablet Take 20 mg by mouth daily before breakfast. 05/02/19   [provider]    Family History History reviewed. No pertinent family history.  Social History Social History   Tobacco Use  . Smoking status: Never Smoker  . Smokeless tobacco: Never Used  Substance Use Topics  . Alcohol use: Not Currently  . Drug use: Not Currently     Allergies  Penicillins   Review of Systems Review of Systems  All other systems reviewed and are negative.    Physical Exam Updated Vital Signs BP (!) 156/87   Pulse (!) 54   Temp 98.2 F (36.8 C) (Oral)   Resp (!) 21   SpO2 100%   Physical Exam Vitals signs and nursing note reviewed.    85 year old female, resting comfortably and in no acute distress. Vital signs are significant for slow heart rate and elevated blood pressure. Oxygen saturation is 100%, which is normal. Head is normocephalic and atraumatic. PERRLA, EOMI. Oropharynx is clear. Neck is nontender and supple without adenopathy or JVD. Back is  nontender and there is no CVA tenderness. Lungs are clear without rales, wheezes, or rhonchi. Chest is nontender. Heart has an irregular rhythm without murmur. Abdomen is soft, flat, nontender without masses or hepatosplenomegaly and peristalsis is normoactive. Extremities have no cyanosis or edema, full range of motion is present. Skin is warm and dry without rash. Neurologic: Awake and alert, oriented to person and place but not time, speech is somewhat rambling and mildly dysarthric, cranial nerves are intact, there are no motor or sensory deficits.  ED Treatments / Results  Labs (all labs ordered are listed, but only abnormal results are displayed) Labs Reviewed  COMPREHENSIVE METABOLIC PANEL - Abnormal; Notable for the following components:      Result Value   Glucose, Bld 113 (*)    Creatinine, Ser 1.39 (*)    GFR calc non Af Amer 34 (*)    GFR calc Af Amer 39 (*)    All other components within normal limits  CBC WITH DIFFERENTIAL/PLATELET - Abnormal; Notable for the following components:   RDW 18.0 (*)    All other components within normal limits  URINALYSIS, ROUTINE W REFLEX MICROSCOPIC - Abnormal; Notable for the following components:   APPearance CLOUDY (*)    Protein, ur 30 (*)    Nitrite POSITIVE (*)    Leukocytes,Ua SMALL (*)    Bacteria, UA FEW (*)    All other components within normal limits  TROPONIN I (HIGH SENSITIVITY) - Abnormal; Notable for the following components:   Troponin I (High Sensitivity) 51 (*)    All other components within normal limits  CULTURE, BLOOD (ROUTINE X 2)  CULTURE, BLOOD (ROUTINE X 2)  URINE CULTURE  SARS CORONAVIRUS 2 (TAT 6-24 HRS)  LACTIC ACID, PLASMA  LACTIC ACID, PLASMA  TROPONIN I (HIGH SENSITIVITY)    EKG BORGHILD, THAKER [id number] 12-Sep-2019 00:07:24 [facility] ROUTINE RECORD Atrial flutter/fibrillation Left bundle branch block When compared with ECG of 08/24/2019, Atrial fibrillation has replaced Sinus  rhythm Confirmed by Delora Fuel ([id number]) on 09/12/2019 12:10:27 AM 16mm/s 79mm/mV 150Hz  9.0.4 CID: [id number] Confirmed By: Delora Fuel Vent. rate 98 BPM PR interval * ms QRS duration 143 ms QT/QTc 404/516 ms P-R-T axes * -37 145 22-Jun-1928 (89 yr) Female Black Room:ED024 Loc:0 Technician: [phone] Test ind:  Radiology Ct Head Wo Contrast  Result Date: 09/12/2019 CLINICAL DATA:  Altered mental status EXAM: CT HEAD WITHOUT CONTRAST TECHNIQUE: Contiguous axial images were obtained from the base of the skull through the vertex without intravenous contrast. COMPARISON:  08/24/2019 FINDINGS: Brain: No acute territorial infarction, hemorrhage, or intracranial mass. Mild encephalomalacia in the right temporal lobe consistent with prior infarct. Atrophy and mild small vessel ischemic changes of the white matter. Stable ventricle size. Vascular: No hyperdense vessels. Vertebral and carotid vascular calcification Skull: Normal. Negative for fracture or focal  lesion. Sinuses/Orbits: Mucosal thickening in the frontal and ethmoid sinuses Other: None IMPRESSION: 1. No CT evidence for acute intracranial abnormality. 2. Atrophy and mild small vessel ischemic changes of the white matter Electronically Signed   By: Jasmine PangKim  Fujinaga M.D.   On: 09/12/2019 01:50   Dg Chest Port 1 View  Result Date: 09/12/2019 CLINICAL DATA:  Altered mental status EXAM: PORTABLE CHEST 1 VIEW COMPARISON:  Radiograph 08/24/2019 FINDINGS: There is a patchy opacity which partially silhouettes portion of the right heart border suspicious for right middle lobe consolidation. The heart is enlarged. The pulmonary vascularity remains well distributed. No pneumothorax or visible effusion. The osseous structures appear diffusely demineralized which may limit detection of small or nondisplaced fractures. Degenerative changes are present in the and imaged spine and shoulders. No acute osseous or soft tissue abnormality. IMPRESSION: 1. Findings  suspicious for right middle lobe consolidation, possibly pneumonia 2. Recommend chest x-ray in 4-6 weeks to document resolution. 3. Cardiac enlargement from comparison portable radiograph, could reflect cardiomegaly or pericardial effusion. Electronically Signed   By: Kreg ShropshirePrice  DeHay M.D.   On: 09/12/2019 00:48    Procedures Procedures   Medications Ordered in ED Medications - No data to display   Initial Impression / Assessment and Plan / ED Course  I have reviewed the triage vital signs and the nursing notes.  Pertinent labs & imaging results that were available during my care of the patient were reviewed by me and considered in my medical decision making (see chart for details).  Altered mental status with hallucinations.  Irregular heart rate.  ECG shows atrial fibrillation which is new compared with August 24, 2019.  Old records are reviewed confirming ED visit on September 23 at which time subacute stroke was identified, and office visit on October 1 at which time Remeron was initiated.  Cause for hallucinations and speech alteration is unclear, but possibilities include: Side effect of mirtazapine, new stroke, decreased cardiac output with atrial fibrillation, occult infection.  Also, atrial fibrillation may be intermittent and is likely related to her recent stroke.  She will be sent for repeat CT of head and will check screening labs.  Chest x-ray and urinalysis will be obtained to look for occult infection.  She did have an elevated troponin at previous ED visit, will repeat troponin level.  CT shows no new changes.  Chest x-ray suggests right middle lobe pneumonia.  Blood cultures and lactic acid are obtained.  Lactic acid is normal.  She is started on ceftriaxone and azithromycin.  Urinalysis has positive nitrite but no significant bacteria or WBCs.  Urine is sent for culture.  Antibiotics for pneumonia would also be appropriate for UTI.  Testing was also done for COVID-19.  There has  been a slight increase in her creatinine compared with recent value, but I doubt this is causing any of her symptoms.  Troponin is again slightly elevated and will need to be trended.  At this point, it seems most likely that her hallucinations are secondary to mirtazapine.  Daughter states that she thinks patient has been on blood thinners while living in [place] but does not know reason why does not know why the blood thinners were discontinued.  We will hold off on initiating heparin while that information is gathered.  Case is discussed with Dr. Vincente PoliWellborn of family practice service who agrees to admit the patient.  CHA2DS2/VAS Stroke Risk Points      6>= 2 Points: High Risk  1 - 1.99 Points:  Medium Risk  0 Points: Low Risk  Score of 6 his elevated.: Last  Change: N/A     This score determines the patient's risk of having a stroke if the  patient has atrial fibrillation.  She scores 2 points for age, one-point for sex, one-point for history of hypertension, 2 points for history of prior stroke.  Quaneisha Hanisch was evaluated in Emergency Department on 09/12/2019 for the symptoms described in the history of present illness. She was evaluated in the context of the global COVID-19 pandemic, which necessitated consideration that the patient might be at risk for infection with the SARS-CoV-2 virus that causes COVID-19. Institutional protocols and algorithms that pertain to the evaluation of patients at risk for COVID-19 are in a state of rapid change based on information released by regulatory bodies including the CDC and federal and state organizations. These policies and algorithms were followed during the patient's care in the ED.  Final Clinical Impressions(s) / ED Diagnoses   Final diagnoses:  Confusion  Visual hallucinations  Atrial fibrillation, unspecified type (HCC)  Community acquired pneumonia of right middle lobe of lung  Urinary tract infection without hematuria, site unspecified   Acute kidney injury (nontraumatic) (HCC)  Elevated troponin    ED Discharge Orders    None       Dione Booze, MD 09/12/19 [phone]

## 2019-09-12 NOTE — Discharge Summary (Signed)
[facility]  Patient name: Mary Chavez Medical record number: [id number] Date of birth: Apr 19, 1928 Age: 85 y.o. Gender: female Date of Admission: 09/12/2019  Date of Discharge: 09/23/2019 Admitting Physician: Doreene ElandKehinde T Eniola, MD  Primary Care Provider: Allayne StackBeard, Samantha N, DO Consultants: Psychiatry   Indication for Hospitalization: Altered mental status, hallucinations  Discharge Diagnoses/Problem List:   Principal Problem:   Pneumonia due to organism Active Problems:   AMS (altered mental status)   Visual hallucinations   Elevated troponin   Delirium due to another medical condition   Community acquired pneumonia of right middle lobe of lung   Fever   Ankle pain   Aspiration pneumonia due to gastric secretions (HCC)   Disposition: discharge to SNF   Discharge Condition: stable, fair  Discharge Exam:  General: Found asleep with her head resting on the bed rail leaning forward.  She noted significant discomfort in her legs throughout our conversation and exam.  Mild distress from her lower extremity pain.  Oriented to person and place. HEENT: Neck non-tender without lymphadenopathy, masses or thyromegaly Cardio: Normal S1 and S2, no S3 or S4. Rhythm is regular. No murmurs or rubs.  Pulm: Clear to auscultation bilaterally, no crackles, wheezing, or diminished breath sounds. Normal respiratory effort Abdomen: Bowel sounds normal. Abdomen soft and non-tender.  Extremities: No notable lower extremity edema.  No notable swelling of thighs or legs bilaterally.  No cordlike masses palpated.  Increased tenderness over right first MTP. Neuro: Cranial nerves grossly intact  Brief Hospital Course:  Mary Haileynnie Mae Allenis a 85 y.o.femalewho presented with altered mental status and hallucinations. PMH is significant forhypertension, GERD, hyperlipidemia, urinary incontinence, history of stroke, and poor appetite.  AMS secondary to UTI and  pneumonia Patient was admitted with altered mental status and endorsing new onset hallucinations.  In the emergency department she received work-up including head CT & MRI which were both negative for intracranial abnormality.  Patient had a chest x-ray suggested and findings suspicious for possible pneumonia.  Patient also had a urinalysis that suggested potential UTI (initial culture grew E Coli that was sensitive to cetriaxone).  She received IV antibiotics.  During her hospitalization she did have one night where she started to have fevers and her antibiotic was broadened. Repeated urine cultures as well as all blood cultures were negative throughout her admission. She had a repeat chest x-ray at this time which was also suspicious for possible pneumonia that had developed in different lung field.With the broadened antibiotics, she defervesced  and remained afebrile for the remaining duration of her hospitalization.  She was transitioned to po cefdinir on 10/17 and completed total course on 10/18. Patient reported ankle pain (left) and was found to have no abnormalities on xray, patient did not report any pain following this incident. At the time of discharge, patient was oriented to self, city and state.  On 10/23, she is found to be medically stable and discharged to SNF.  UTI UA on admission was positive for leukocytes and nitrates with urine culture growing 40,000 units of E. coli.  She was treated with a 7-day course of cefdinir and showed good improvement.  She is completed with her antibiotic regimen at the time of discharge.  Pneumonia On day 3 of her hospitalization, she was found to have a new retrocardiac consolidation on chest x-ray and fever.  She completed a 7-day course of antibiotics composed of ceftriaxone and cefdinir.  She completed her antibiotic course of the  time of discharge, showed good improvement and was breathing comfortably on room air.  Gout flare On 10/23, she was found to  have new significant pain in her lower extremities appear to be consistent with a gout flare.  She was started on colchicine for 2 doses and steroid regimen for 5 days.  She was sent out on scheduled Tylenol until symptoms resolved.  Her home medication HCTZ was discontinued as it may have contributed to this gout flare.   Issues for Follow Up:  1. HCTZ and lisinopril discontinued.  Coreg started in the hospital.  Please assess heart rate and blood pressure. 2. Palliative referral placed.  Please ensure good follow-up with palliative care outpatient. 3.   Please assess for gout symptoms and consider further therapy as needed.  Significant Procedures: none  Significant Labs and Imaging:  Recent Labs  Lab 09/21/19 0254 09/22/19 0347 09/23/19 0856  WBC 13.3* 12.8* 9.8  HGB 9.8* 9.0* 8.9*  HCT 30.9* 28.7* 27.2*  PLT 311 322 272   Recent Labs  Lab 09/19/19 0836 09/20/19 0305 09/21/19 0254 09/22/19 0347 09/23/19 0820  NA 143 141 140 140 139  K 3.5 3.2* 3.1* 3.5 3.2*  CL 110 108 109 107 106  CO2 21* 23 24 23 24   GLUCOSE 83 97 109* 99 123*  BUN 13 15 11 14 16   CREATININE 0.93 0.91 0.84 0.89 0.89  CALCIUM 9.2 8.8* 8.8* 8.6* 8.6*   Ct Head Wo Contrast  Result Date: 09/12/2019 CLINICAL DATA:  Altered mental status EXAM: CT HEAD WITHOUT CONTRAST TECHNIQUE: Contiguous axial images were obtained from the base of the skull through the vertex without intravenous contrast. COMPARISON:  08/24/2019 FINDINGS: Brain: No acute territorial infarction, hemorrhage, or intracranial mass. Mild encephalomalacia in the right temporal lobe consistent with prior infarct. Atrophy and mild small vessel ischemic changes of the white matter. Stable ventricle size. Vascular: No hyperdense vessels. Vertebral and carotid vascular calcification Skull: Normal. Negative for fracture or focal lesion. Sinuses/Orbits: Mucosal thickening in the frontal and ethmoid sinuses Other: None IMPRESSION: 1. No CT evidence for  acute intracranial abnormality. 2. Atrophy and mild small vessel ischemic changes of the white matter Electronically Signed   By: 11/12/2019 M.D.   On: 09/12/2019 01:50   Mr Brain Wo Contrast  Result Date: 09/12/2019 CLINICAL DATA:  Altered mental status and hallucinations. EXAM: MRI HEAD WITHOUT CONTRAST TECHNIQUE: Multiplanar, multiecho pulse sequences of the brain and surrounding structures were obtained without intravenous contrast. COMPARISON:  Head CT 09/12/2019 FINDINGS: Brain: The study suffers from motion degradation. Diffusion imaging does not show evidence of a large acute or subacute infarction. A small insult may not be visible given this degree of motion degradation. No focal lesion is seen affecting the brainstem or cerebellum. Cerebral hemispheres show generalized atrophy with chronic encephalomalacia in the right temporal lobe which could be due to old ischemic infarction or old trauma. There is an old infarction in the left basal ganglia/external capsule region. There are mild chronic small-vessel changes of the hemispheric white matter. No evidence of mass, hemorrhage, hydrocephalus or extra-axial collection. Vascular: Major vessels at the base of the brain show flow. Skull and upper cervical spine: Negative Sinuses/Orbits: Clear/normal Other: None IMPRESSION: Motion degraded examination. No acute or reversible finding identified. Generalized brain atrophy. Chronic encephalomalacia in the right temporal lobe that could be due to old ischemic infarction or trauma. Old infarction in the left basal ganglia/external capsule. Mild chronic small-vessel change of the hemispheric white matter. Electronically  Signed   By: Nelson Chimes M.D.   On: 09/12/2019 08:12   Dg Chest Port 1 View  Result Date: 09/12/2019 CLINICAL DATA:  Altered mental status EXAM: PORTABLE CHEST 1 VIEW COMPARISON:  Radiograph 08/24/2019 FINDINGS: There is a patchy opacity which partially silhouettes portion of the right  heart border suspicious for right middle lobe consolidation. The heart is enlarged. The pulmonary vascularity remains well distributed. No pneumothorax or visible effusion. The osseous structures appear diffusely demineralized which may limit detection of small or nondisplaced fractures. Degenerative changes are present in the and imaged spine and shoulders. No acute osseous or soft tissue abnormality. IMPRESSION: 1. Findings suspicious for right middle lobe consolidation, possibly pneumonia 2. Recommend chest x-ray in 4-6 weeks to document resolution. 3. Cardiac enlargement from comparison portable radiograph, could reflect cardiomegaly or pericardial effusion. Electronically Signed   By: Lovena Le M.D.   On: 09/12/2019 00:48    Results/Tests Pending at Time of Discharge: none  Discharge Medications:  Allergies as of 09/23/2019      Reactions   Penicillins Swelling, Other (See Comments)   Did it involve swelling of the face/tongue/throat, SOB, or low BP? No- body became swollen, no breathing issues, though Did it involve sudden or severe rash/hives, skin peeling, or any reaction on the inside of your mouth or nose? No Did you need to seek medical attention at a hospital or doctor's office? Unk When did it last happen? "A long time ago" If all above answers are "NO", may proceed with cephalosporin use.      Medication List    STOP taking these medications   lisinopril-hydrochlorothiazide 20-25 MG tablet Commonly known as: ZESTORETIC     TAKE these medications   acetaminophen 500 MG tablet Commonly known as: TYLENOL Take 2 tablets (1,000 mg total) by mouth every 8 (eight) hours. Please schedule Tylenol for until her gout pain resolves and then reduce the medication to an as needed basis. What changed:   when to take this  reasons to take this  additional instructions   apixaban 5 MG Tabs tablet Commonly known as: ELIQUIS Take 1 tablet (5 mg total) by mouth 2 (two) times  daily.   carvedilol 3.125 MG tablet Commonly known as: COREG Take 1 tablet (3.125 mg total) by mouth 2 (two) times daily with a meal.   famotidine 20 MG tablet Commonly known as: Pepcid Take 1 tablet (20 mg total) by mouth daily.   Geritol Liqd Take 15 mLs by mouth daily.   mirtazapine 15 MG tablet Commonly known as: Remeron Take 1 tablet (15 mg total) by mouth at bedtime.   multivitamin with minerals Tabs tablet Take 1 tablet by mouth daily.   ondansetron 4 MG disintegrating tablet Commonly known as: Zofran ODT Take 1 tablet (4 mg total) by mouth every 8 (eight) hours as needed for nausea or vomiting.   predniSONE 20 MG tablet Commonly known as: DELTASONE Take 1 tablet (20 mg total) by mouth daily with breakfast. Start taking on: September 24, 2019   Ventolin HFA 108 (90 Base) MCG/ACT inhaler Generic drug: albuterol Inhale 1-2 puffs into the lungs every 6 (six) hours as needed for wheezing or shortness of breath.       Discharge Instructions: Please refer to Patient Instructions section of EMR for full details.  Patient was counseled important signs and symptoms that should prompt return to medical care, changes in medications, dietary instructions, activity restrictions, and follow up appointments.   Follow-Up Appointments: Contact  information for after-discharge care    Destination    [facility] .   Service: Skilled Nursing Contact information: [address] [place] [address] [phone]              Mirian Mo, MD 09/23/2019, 12:34 PM PGY-2, [facility]

## 2019-09-12 NOTE — Evaluation (Signed)
Physical Therapy Evaluation Patient Details Name: Mary Chavez MRN: [id number] DOB: October 03, 1928 Today's Date: 09/12/2019   History of Present Illness  Pt is an 85 y/o female admitted secondary to AMS and hallucinations. Workup pending. MRI negative for acute abnormality, however, did showe chronic encephalomacia. PMH includes HTN, CVA, and a fib.   Clinical Impression  Pt admitted secondary to problem above with deficits below. Pt limited this session secondary to agitation. Was also difficult to redirect. Pt having hallucinations and asking "man in the room" to help her stand, although PT was initially the only one present. Pt adamant about standing, and required mod A +2 to stand. Pt adamant about wanting to walk and required max education that it was not safe at this time. Pt required max A +2 to return to bed secondary to agitation. Feel pt is at increased risk for falls and will require increased assist at d/c. Will continue to follow acutely to maximize functional mobility independence and safety.      Follow Up Recommendations SNF;Supervision/Assistance - 24 hour    Equipment Recommendations  Other (comment)(TBD)    Recommendations for Other Services       Precautions / Restrictions Precautions Precautions: Fall Restrictions Weight Bearing Restrictions: No      Mobility  Bed Mobility Overal bed mobility: Needs Assistance Bed Mobility: Supine to Sit;Sit to Supine     Supine to sit: Mod assist Sit to supine: Max assist;+2 for physical assistance   General bed mobility comments: Mod A for LE and trunk assist to come to sitting. Pt requiring +2 assist to return to bed given increased agitation. RN in room to assist.   Transfers Overall transfer level: Needs assistance Equipment used: Rolling walker (2 wheeled) Transfers: Sit to/from Stand Sit to Stand: Mod assist;+2 physical assistance;+2 safety/equipment         General transfer comment: Attempted to stand with  +1 assist, however, pt unsteady and unsafe with technique. Would not listen to cues for safe technique to stand with RW. Had to call for second person as pt refusing to stay seated. Required mod A +2 to safely stand. Pt hallucinating and asking for "man in the room" to help her stand. Eventually got pt back to sitting position as was unsafe to ambulate given agitation.   Ambulation/Gait                Stairs            Wheelchair Mobility    Modified Rankin (Stroke Patients Only)       Balance Overall balance assessment: Needs assistance Sitting-balance support: No upper extremity supported;Feet supported Sitting balance-Leahy Scale: Good     Standing balance support: Bilateral upper extremity supported;During functional activity Standing balance-Leahy Scale: Poor Standing balance comment: Reliant on UE and external support.                              Pertinent Vitals/Pain Pain Assessment: No/denies pain    Home Living Family/patient expects to be discharged to:: Unsure                 Additional Comments: Pt unable to provide home information given cognitive deficits.     Prior Function           Comments: Unsure of PLOF given cognitive deficits.      Hand Dominance        Extremity/Trunk Assessment   Upper Extremity Assessment  Upper Extremity Assessment: Defer to OT evaluation    Lower Extremity Assessment Lower Extremity Assessment: Generalized weakness    Cervical / Trunk Assessment Cervical / Trunk Assessment: Normal  Communication   Communication: No difficulties  Cognition Arousal/Alertness: Awake/alert Behavior During Therapy: Agitated Overall Cognitive Status: No family/caregiver present to determine baseline cognitive functioning                                 General Comments: Pt getting agitated and wanting to walk to chair, even with max cues that it was unsafe at this time. Required +2 assist  to return pt back to bed. Pt also hallucinating and asking man in room to help her stand. Pt with very poor safety awareness.       General Comments      Exercises     Assessment/Plan    PT Assessment Patient needs continued PT services  PT Problem List Decreased balance;Decreased strength;Decreased mobility;Decreased knowledge of use of DME;Decreased cognition;Decreased safety awareness;Decreased knowledge of precautions       PT Treatment Interventions Gait training;Stair training;DME instruction;Functional mobility training;Therapeutic activities;Balance training;Therapeutic exercise;Cognitive remediation;Patient/family education    PT Goals (Current goals can be found in the Care Plan section)  Acute Rehab PT Goals PT Goal Formulation: Patient unable to participate in goal setting Time For Goal Achievement: 09/26/19 Potential to Achieve Goals: Fair    Frequency Min 2X/week   Barriers to discharge Other (comment) Unsure of caregiver support     Co-evaluation               AM-PAC PT "6 Clicks" Mobility  Outcome Measure Help needed turning from your back to your side while in a flat bed without using bedrails?: A Lot Help needed moving from lying on your back to sitting on the side of a flat bed without using bedrails?: A Lot Help needed moving to and from a bed to a chair (including a wheelchair)?: A Lot Help needed standing up from a chair using your arms (e.g., wheelchair or bedside chair)?: A Lot Help needed to walk in hospital room?: A Lot Help needed climbing 3-5 steps with a railing? : Total 6 Click Score: 11    End of Session Equipment Utilized During Treatment: Gait belt Activity Tolerance: Treatment limited secondary to agitation Patient left: in bed;with call bell/phone within reach;with bed alarm set Nurse Communication: Mobility status;Other (comment)(pt agitation; hallucinations) PT Visit Diagnosis: Other abnormalities of gait and mobility  (R26.89);Unsteadiness on feet (R26.81);Muscle weakness (generalized) (M62.81)    Time: 9622-2979 PT Time Calculation (min) (ACUTE ONLY): 21 min   Charges:   PT Evaluation $PT Eval Moderate Complexity: [place], PT, DPT  Acute Rehabilitation Services  Pager: [phone] Office: [phone]   Rudean Hitt 09/12/2019, 6:05 PM

## 2019-09-12 NOTE — H&P (Addendum)
[facility] Admission History and Physical Service Pager: [phone]  Patient name: Mary Chavez Medical record number: [id number] Date of birth: [birth year] Age: 85 y.o. Gender: female  Primary Care Provider: Patriciaann Clan, DO Consultants: None Code Status: Full Code  Preferred Emergency Contact: Mary Chavez (Daughter in law) [phone]  Chief Complaint: Altered mental status, hallucinations  Assessment and Plan: Mary Chavez is a 85 y.o. female presenting with altered mental status and hallucinations. PMH is significant for hypertension, GERD, hyperlipidemia, urinary incontinence, history of stroke, and poor appetite.  Altered mental status  Patient with altered mental status, endorsing hallucinations. Head CT was clear of acute intracranial abnormalities.  MRI pending to have rule out stroke.  Patient without focal neurologic changing/findings on exam.  Infectious etiology is a possibility with CXR "findings suspicious for right middle lobe consolidation, possible pneumonia".  Patient also has urinalysis with positive nitrite, small leukocytes, few bacteria, as well as a history of the the patient's son noting patient's urine being dark with a foul smell.  UTI seems to be likely etiology. However, patient does not have an elevated white blood cell count and has not had any fevers.  Metabolic etiology is a possibility, however CMP is normal.  Ammonia level and TSH are currently pending.  The patient did have a somewhat similar presentation approximately 15 to 20 years ago but was related to her alcohol consumption at this time, however patient's daughter-in-law states the patient has not drank in approximately 10 years.  Alcohol level and tox screen pending.  Patient has had several medication changes over the past few weeks since moving here from [place], including the addition of mirtazapine.  The patient's altered status, inability to sleep and pacing  with visual hallucinations after starting mirtazapine could also be explained by an underlying history of bipolar disorder being pushed into a manic state after initiating that medication however with the patient's current age and the daughter-in-law suggestive the patient has never had any sort of psych history this is low on differential. Timeline of changing medications with new hallucinations does point to this cause. No h/o trauma or recent fall, most recent fall was when patient was in [place] prior to moving to [place] in July. Patient also with Afib (new vs. Chronic hx) not on anticoagulation. Can consider CVA as possible cause, CT negative but MRI pending. CHADSVASC of 20 (age, sex, h/o HTN, h/o CVA).   -Admit to med telemetry, attending Dr. Gwendlyn Deutscher -MRI pending -N.p.o. pending speech eval -Normal saline at maintenance -Check ammonia level -Morning CMP -Tox screen and ethanol -A1c -TSH -Vitals per floor -Neurochecks -Continuous pulse ox -trend troponin -consider psych eval if no improvement -continue CTX (10/12-)  Atrial fibrillation Unclear if this is new or not.  Patient's daughter-in-law did talk to patient's daughter after the ED provider informed her that the patient was in atrial fibrillation, patient's daughter expressed that she believed the patient had this diagnosis prior to this ED visit.  Initial high-sensitivity troponin was elevated at 51. -Trend troponins -Cardiac monitoring -AM EKG  AKI Creatinine on admission of 1.39.  Per chart review baseline appears to be around 1.0 -IV fluids as above -CMP in the morning -Continue to monitor  ?Pneumonia CXR on admission showing possible right middle lobe pneumonia. Patient otherwise asymptomatic without cough, SOB, or fever. Lungs CTAB with good air movement and speaking full sentences. Low liklihood for true pneumonia given good clinical picture -continue to monitor -vitals q4h -s/p azithro  and CTX in ED -f/u CTX in  4-6 weeks    Hypertension Hypertensive in ED to 176/98. Appears to be normotensive at PCP with current regimen. Home medications include lisinopril-hydrochlorothiazide -Continue home medications when no longer n.p.o. -allow permissive HTN until MRI results to ensure no acute infarcts   Hyperlipidemia Home medications include Lipitor. -Continue home medications once no longer n.p.o.  Urinary incontinence Home medications include oxybutynin -Hold oxybutynin, consider discontinuing at dc given age  GERD All medications include Protonix 20, Pepcid 20 mg. -Continue home medications once no longer n.p.o. -Per PCP note on 8/25 patient was to be discontinued off pantoprazole 2/2 AE of osteoporosis   History of stroke Prior imaging had suggested signs of previous infarct.  At home medications do include Plavix which may been prescribed for this.  Patient's daughter-in-law states she plans to contact patient's previous PCP to get medical records released for [place]. -Follow-up on patient's previous PCP medical records -continue plavix when able to tolerate PO  QTc Prolongation EKG showing QTc of 516, up from 490 on 9/23. Does have home zofran  -hold QTc prolonging meds  -AM EKG -continuous cardiac monitoring  H/o fall Daughter reports h/o fall while living in [place]. Has residual right sided LE weakness 2/2 fall.  -fall precautions -PT/OT  FEN/GI: NPO pending speech eval Prophylaxis: Subcu heparin  Disposition: Admit to med telemetry  History of Present Illness:  Mary Chavez is a 85 y.o. female presenting with altered mental status and hallucinations.  Most of the history was obtained through patient's daughter-in-law, Mary Chavez.  Patient was in her normal state of health on this past Saturday, 10/10.  The patient then awoke at approximately 3 AM on Sunday 10/11 to prepare for her doctor's appointments later that morning.  The patient's daughter-in-law, who lives with the patient,  informed the patient that it was too early.  However the patient's daughter-in-law states that approximately 4 AM she recognized that something was not right with the patient as she was complaining of seeing people in the yard that were trying to come into the house.  Patient's daughter-in-law stated that the patient was pacing around the home and would not go back to bed, continuing to complain of seeing people trying to come into the home.  The patient was able to consume approximately one half of a sausage bacon for breakfast that morning and also ate a small amount of food for dinner at approximately 330.  Patient's daughter-in-law states that the patient did have a prior bout similar to this approximately 15-20 years ago but at that time she was drinking.  According to the patient's daughter-in-law she has not had a drink in over 10 years.  The patient did not complain of any pain and per the patient's daughter-in-law she has not witnessed any cough or other signs of respiratory infection the past few days.  Patient's daughter-in-law states that her husband did notice a color change in odor change in urine when the patient relieved herself and did not flush the toilet.  Patient's daughter-in-law states that at baseline patient can complete her ADLs and is normally A&Ox3.  Patient lives with her daughter-in-law and son.  Of note the patient had recently moved here from [place] and established with family practice as her new PCP.  She has had several medication changes during a recent appointment, including mirtazapine that was prescribed on 10/1.  In ED patient received w/u including CT, UA, CBC, CMP. A fib was seen on  EKG and telemetry. Patient with elevated troponin in ED as well. CXR in ED showed possible pneumonia so azithro and CTX was administered.    Review Of Systems: Per HPI with the following additions:   Review of Systems  Unable to perform ROS: Mental status change    Patient  Active Problem List   Diagnosis Date Noted  . AMS (altered mental status) 09/12/2019  . Stroke (HCC) 09/02/2019  . Microcytic anemia 08/09/2019  . Encounter to establish care with new doctor 07/27/2019  . Essential hypertension 07/27/2019  . GERD (gastroesophageal reflux disease) 07/27/2019  . Hyperlipidemia 07/27/2019  . Urinary incontinence in female 07/27/2019  . Poor appetite 07/27/2019    Past Medical History: Past Medical History:  Diagnosis Date  . Hypertension     Past Surgical History: History reviewed. No pertinent surgical history.  Social History: Social History   Tobacco Use  . Smoking status: Never Smoker  . Smokeless tobacco: Never Used  Substance Use Topics  . Alcohol use: Not Currently  . Drug use: Not Currently   Additional social history: lives with son and daughter in law, stopped drinking 10 years ago, denies tobacco or illicit drug use. At baseline patient can do hygiene on her own, daughter in law helps with medications. Walks with a walker.   Please also refer to relevant sections of EMR.  Family History: History reviewed. No pertinent family history.  Allergies and Medications: Allergies  Allergen Reactions  . Penicillins Swelling and Other (See Comments)    Did it involve swelling of the face/tongue/throat, SOB, or low BP? No- body became swollen, no breathing issues, though Did it involve sudden or severe rash/hives, skin peeling, or any reaction on the inside of your mouth or nose? No Did you need to seek medical attention at a hospital or doctor's office? Unk When did it last happen? "A long time ago" If all above answers are "NO", may proceed with cephalosporin use.    No current facility-administered medications on file prior to encounter.    Current Outpatient Medications on File Prior to Encounter  Medication Sig Dispense Refill  . acetaminophen (TYLENOL) 500 MG tablet Take 2 tablets (1,000 mg total) by mouth every 6 (six) hours  as needed for moderate pain. 60 tablet 0  . albuterol (VENTOLIN HFA) 108 (90 Base) MCG/ACT inhaler Inhale 1-2 puffs into the lungs every 6 (six) hours as needed for wheezing or shortness of breath.    Marland Kitchen atorvastatin (LIPITOR) 20 MG tablet Take 20 mg by mouth daily.    . clopidogrel (PLAVIX) 75 MG tablet Take 75 mg by mouth daily.    Marland Kitchen doxazosin (CARDURA) 4 MG tablet Take 4 mg by mouth at bedtime.    . famotidine (PEPCID) 20 MG tablet Take 1 tablet (20 mg total) by mouth daily. 90 tablet 3  . hydrALAZINE (APRESOLINE) 25 MG tablet Take 25 mg by mouth 2 (two) times daily.    Marland Kitchen lisinopril-hydrochlorothiazide (ZESTORETIC) 20-25 MG tablet Take 2 tablets by mouth daily.    . mirtazapine (REMERON) 15 MG tablet Take 1 tablet (15 mg total) by mouth at bedtime. 30 tablet 0  . ondansetron (ZOFRAN ODT) 4 MG disintegrating tablet Take 1 tablet (4 mg total) by mouth every 8 (eight) hours as needed for nausea or vomiting. 20 tablet 0  . oxybutynin (DITROPAN) 5 MG tablet Take 5 mg by mouth daily.     . pantoprazole (PROTONIX) 20 MG tablet Take 20 mg by mouth daily before breakfast.  Objective: BP (!) 171/95 (BP Location: Right Arm)   Pulse 85   Temp 98 F (36.7 C) (Oral)   Resp 18   SpO2 98%  Exam: General: Alert and oriented to person and place, no apparent distress Eyes: PERRLA, EOMI ENTM: No pharyengeal erythema Cardiovascular: Irregular rhythm, regular rate, no MRG Respiratory: CTA bilaterally  Gastrointestinal: No abdominal pain. Normal bowel sounds, soft, non distended MSK: Upper extremity strength 5/5 bilaterally, Lower extremity strength 5/5 in LLE, 4/5 in RLE  Derm: No rashes noted Neuro: Cranial nerves grossly intact, sensation intact, following commands  Labs and Imaging: CBC BMET  Recent Labs  Lab 09/12/19 0200  WBC 8.0  HGB 12.9  HCT 40.5  PLT 193   Recent Labs  Lab 09/12/19 0200  NA 142  K 3.8  CL 106  CO2 24  BUN 23  CREATININE 1.39*  GLUCOSE 113*  CALCIUM 9.9       Ref. Range 09/12/2019 02:00  Troponin I (High Sensitivity) Latest Ref Range: <18 ng/L 51 (H)    Ref. Range 09/12/2019 02:00  Lactic Acid, Venous Latest Ref Range: 0.5 - 1.9 mmol/L 1.4   Urinalysis    Component Value Date/Time   COLORURINE YELLOW 09/12/2019 0307   APPEARANCEUR CLOUDY (A) 09/12/2019 0307   LABSPEC 1.021 09/12/2019 0307   PHURINE 6.0 09/12/2019 0307   GLUCOSEU NEGATIVE 09/12/2019 0307   HGBUR NEGATIVE 09/12/2019 0307   BILIRUBINUR NEGATIVE 09/12/2019 0307   KETONESUR NEGATIVE 09/12/2019 0307   PROTEINUR 30 (A) 09/12/2019 0307   NITRITE POSITIVE (A) 09/12/2019 0307   LEUKOCYTESUR SMALL (A) 09/12/2019 0307    Ct Head Wo Contrast  Result Date: 09/12/2019 CLINICAL DATA:  Altered mental status EXAM: CT HEAD WITHOUT CONTRAST TECHNIQUE: Contiguous axial images were obtained from the base of the skull through the vertex without intravenous contrast. COMPARISON:  08/24/2019 FINDINGS: Brain: No acute territorial infarction, hemorrhage, or intracranial mass. Mild encephalomalacia in the right temporal lobe consistent with prior infarct. Atrophy and mild small vessel ischemic changes of the white matter. Stable ventricle size. Vascular: No hyperdense vessels. Vertebral and carotid vascular calcification Skull: Normal. Negative for fracture or focal lesion. Sinuses/Orbits: Mucosal thickening in the frontal and ethmoid sinuses Other: None IMPRESSION: 1. No CT evidence for acute intracranial abnormality. 2. Atrophy and mild small vessel ischemic changes of the white matter Electronically Signed   By: Jasmine Pang M.D.   On: 09/12/2019 01:50   Ct Head Wo Contrast  Result Date: 08/24/2019 CLINICAL DATA:  Altered level of consciousness (LOC), unexplained. Patient reports weakness and dizziness, and headache. EXAM: CT HEAD WITHOUT CONTRAST TECHNIQUE: Contiguous axial images were obtained from the base of the skull through the vertex without intravenous contrast. COMPARISON:  None.  FINDINGS: Brain: Age-indeterminate small to moderate cortical infarct in the right temporal lobe, series 3, image 13. No intracranial hemorrhage. Age related atrophy with mild to moderate chronic small vessel ischemia. Bilateral symmetric basal gangliar mineralization is likely physiologic. No midline shift or mass effect. No hydrocephalus. Vascular: Atherosclerosis of skullbase vasculature without hyperdense vessel or abnormal calcification. Skull: No fracture or focal lesion. Sinuses/Orbits: Mucosal thickening and mucous retention cysts in the left frontal sinus in superior ethmoid air cells. No sinus fluid levels. Bilateral lens extraction. Mastoid air cells are clear. Other: None. IMPRESSION: 1. 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 2. Age related atrophy and chronic small vessel ischemia. Electronically Signed   By: Narda Rutherford M.D.   On: 08/24/2019 20:17  Dg Chest Port 1 View  Result Date: 09/12/2019 CLINICAL DATA:  Altered mental status EXAM: PORTABLE CHEST 1 VIEW COMPARISON:  Radiograph 08/24/2019 FINDINGS: There is a patchy opacity which partially silhouettes portion of the right heart border suspicious for right middle lobe consolidation. The heart is enlarged. The pulmonary vascularity remains well distributed. No pneumothorax or visible effusion. The osseous structures appear diffusely demineralized which may limit detection of small or nondisplaced fractures. Degenerative changes are present in the and imaged spine and shoulders. No acute osseous or soft tissue abnormality. IMPRESSION: 1. Findings suspicious for right middle lobe consolidation, possibly pneumonia 2. Recommend chest x-ray in 4-6 weeks to document resolution. 3. Cardiac enlargement from comparison portable radiograph, could reflect cardiomegaly or pericardial effusion. Electronically Signed   By: Kreg ShropshirePrice  DeHay M.D.   On: 09/12/2019 00:48   Dg Chest Port 1 View  Result Date:  08/24/2019 CLINICAL DATA:  Weakness, dizziness, headache EXAM: PORTABLE CHEST 1 VIEW COMPARISON:  None. FINDINGS: Heart is mildly enlarged. No confluent opacities or effusions. Mild hyperinflation. No acute bony abnormality. Degenerative changes in the shoulders. IMPRESSION: Mild hyperinflation.  Mild cardiomegaly.  No active disease. Electronically Signed   By: Charlett NoseKevin  Dover M.D.   On: 08/24/2019 18:15     Jackelyn PolingWelborn, Ryan, DO 09/12/2019, 5:35 AM PGY-1, [facility] FPTS Intern pager: [phone], text pages welcome   FPTS Upper-Level Resident Addendum   I have independently interviewed and examined the patient. I have discussed the above with the original author and agree with their documentation. My edits for correction/addition/clarification are in blue. Please see also any attending notes.   Oralia ManisSherin Aggie Douse, DO PGY-3, [facility] 09/12/2019 6:03 AM  FPTS Service pager: [phone] (text pages welcome through [facility])

## 2019-09-12 NOTE — ED Notes (Signed)
ED TO INPATIENT HANDOFF REPORT  ED Nurse Name and Phone #:   S Name/Age/Gender Mary HildingAnnie Mae Chavez 85 y.o. female Room/Bed: 041C/041C  Code Status   Code Status: Full Code  Home/SNF/Other Home Patient oriented to: self and place Is this baseline? Yes   Triage Complete: Triage complete  Chief Complaint AMS   Triage Note Pt arrives via [facility] Per family pt has been altered since 0300 on 10/11. Per family pt was unable to appropriately recognize son, and was having visual hallucinations. Pt has a hx of HTN and CVA with residual slurred speach   Allergies Allergies  Allergen Reactions  . Penicillins Swelling and Other (See Comments)    Did it involve swelling of the face/tongue/throat, SOB, or low BP? No- body became swollen, no breathing issues, though Did it involve sudden or severe rash/hives, skin peeling, or any reaction on the inside of your mouth or nose? No Did you need to seek medical attention at a hospital or doctor's office? Unk When did it last happen? "A long time ago" If all above answers are "NO", may proceed with cephalosporin use.     Level of Care/Admitting Diagnosis ED Disposition    ED Disposition Condition Comment   Admit  [facility] [[id number]]  Level of Care: Telemetry Medical [104]  Covid Evaluation: Asymptomatic Screening Protocol (No Symptoms)  Diagnosis: AMS (altered mental status) [1610960][[id number]]  Admitting Physician: Doreene ElandENIOLA, KEHINDE T [[id number]]  Attending Physician: Janit PaganENIOLA, KEHINDE T [[id number]]  PT Class (Do Not Modify): Observation [104]  PT Acc Code (Do Not Modify): Observation [10022]       B Medical/Surgery History Past Medical History:  Diagnosis Date  . Hypertension    History reviewed. No pertinent surgical history.   A IV Location/Drains/Wounds Patient Lines/Drains/Airways Status   Active Line/Drains/Airways    Name:   Placement date:   Placement time:   Site:   Days:   Peripheral IV 09/12/19 Left Hand    09/12/19    -    Hand   less than 1   Peripheral IV 09/12/19 Left Wrist   09/12/19    0212    Wrist   less than 1          Intake/Output Last 24 hours  Intake/Output Summary (Last 24 hours) at 09/12/2019 [id number] Last data filed at 09/12/2019 [id number] Gross per 24 hour  Intake 100 ml  Output 100 ml  Net 0 ml    Labs/Imaging Results for orders placed or performed during the hospital encounter of 09/12/19 (from the past 48 hour(s))  Comprehensive metabolic panel     Status: Abnormal   Collection Time: 09/12/19  2:00 AM  Result Value Ref Range   Sodium 142 135 - 145 mmol/L   Potassium 3.8 3.5 - 5.1 mmol/L   Chloride 106 98 - 111 mmol/L   CO2 24 22 - 32 mmol/L   Glucose, Bld 113 (H) 70 - 99 mg/dL   BUN 23 8 - 23 mg/dL   Creatinine, Ser 9.141.39 (H) 0.44 - 1.00 mg/dL   Calcium 9.9 8.9 - 78.210.3 mg/dL   Total Protein 7.9 6.5 - 8.1 g/dL   Albumin 3.7 3.5 - 5.0 g/dL   AST 25 15 - 41 U/L   ALT 12 0 - 44 U/L   Alkaline Phosphatase 57 38 - 126 U/L   Total Bilirubin 0.9 0.3 - 1.2 mg/dL   GFR calc non Af Amer 34 (L) >60 mL/min   GFR calc Af  Amer 39 (L) >60 mL/min   Anion gap 12 5 - 15    Comment: Performed at [facility], [address], [place], [place] [address]  Troponin I (High Sensitivity)     Status: Abnormal   Collection Time: 09/12/19  2:00 AM  Result Value Ref Range   Troponin I (High Sensitivity) 51 (H) <18 ng/L    Comment: (NOTE) Elevated high sensitivity troponin I (hsTnI) values and significant  changes across serial measurements may suggest ACS but many other  chronic and acute conditions are known to elevate hsTnI results.  Refer to the "Links" section for chest pain algorithms and additional  guidance. Performed at [facility], [address], [place], [place] [address]   CBC with Differential     Status: Abnormal   Collection Time: 09/12/19  2:00 AM  Result Value Ref Range   WBC 8.0 4.0 - 10.5 K/uL   RBC 4.86 3.87 - 5.11 MIL/uL   Hemoglobin 12.9 12.0 -  15.0 g/dL   HCT 09.8 11.9 - 14.7 %   MCV 83.3 80.0 - 100.0 fL   MCH 26.5 26.0 - 34.0 pg   MCHC 31.9 30.0 - 36.0 g/dL   RDW 82.9 (H) 56.2 - 13.0 %   Platelets 193 150 - 400 K/uL   nRBC 0.0 0.0 - 0.2 %   Neutrophils Relative % 51 %   Neutro Abs 4.1 1.7 - 7.7 K/uL   Lymphocytes Relative 36 %   Lymphs Abs 2.9 0.7 - 4.0 K/uL   Monocytes Relative 8 %   Monocytes Absolute 0.6 0.1 - 1.0 K/uL   Eosinophils Relative 3 %   Eosinophils Absolute 0.2 0.0 - 0.5 K/uL   Basophils Relative 2 %   Basophils Absolute 0.1 0.0 - 0.1 K/uL   Immature Granulocytes 0 %   Abs Immature Granulocytes 0.02 0.00 - 0.07 K/uL    Comment: Performed at [facility], [address] [place], [place] [address]  Culture, blood (routine x 2)     Status: None (Preliminary result)   Collection Time: 09/12/19  2:00 AM   Specimen: BLOOD LEFT ARM  Result Value Ref Range   Specimen Description BLOOD LEFT ARM    Special Requests      BOTTLES DRAWN AEROBIC AND ANAEROBIC Blood Culture results may not be optimal due to an inadequate volume of blood received in culture bottles   Culture      NO GROWTH < 12 HOURS Performed at [facility], [address] [place], [place] [address]    Report Status PENDING   Lactic acid, plasma     Status: None   Collection Time: 09/12/19  2:00 AM  Result Value Ref Range   Lactic Acid, Venous 1.4 0.5 - 1.9 mmol/L    Comment: Performed at [facility], [address] [place], [place] [address]  Culture, blood (routine x 2)     Status: None (Preliminary result)   Collection Time: 09/12/19  2:10 AM   Specimen: BLOOD RIGHT ARM  Result Value Ref Range   Specimen Description BLOOD RIGHT ARM    Special Requests      BOTTLES DRAWN AEROBIC AND ANAEROBIC Blood Culture adequate volume   Culture      NO GROWTH < 12 HOURS Performed at [facility], [address] [place], [place] [address]    Report Status PENDING   Urinalysis, Routine w reflex microscopic  Status:  Abnormal   Collection Time: 09/12/19  3:07 AM  Result Value Ref Range   Color, Urine YELLOW YELLOW   APPearance CLOUDY (A) CLEAR   Specific Gravity, Urine 1.021 1.005 - 1.030   pH 6.0 5.0 - 8.0   Glucose, UA NEGATIVE NEGATIVE mg/dL   Hgb urine dipstick NEGATIVE NEGATIVE   Bilirubin Urine NEGATIVE NEGATIVE   Ketones, ur NEGATIVE NEGATIVE mg/dL   Protein, ur 30 (A) NEGATIVE mg/dL   Nitrite POSITIVE (A) NEGATIVE   Leukocytes,Ua SMALL (A) NEGATIVE   RBC / HPF 0-5 0 - 5 RBC/hpf   WBC, UA 6-10 0 - 5 WBC/hpf   Bacteria, UA FEW (A) NONE SEEN   Squamous Epithelial / LPF 6-10 0 - 5   Hyaline Casts, UA PRESENT     Comment: Performed at [facility], [address] [place], [place] [address]  Urine rapid drug screen (hosp performed)     Status: None   Collection Time: 09/12/19  7:26 AM  Result Value Ref Range   Opiates NONE DETECTED NONE DETECTED   Cocaine NONE DETECTED NONE DETECTED   Benzodiazepines NONE DETECTED NONE DETECTED   Amphetamines NONE DETECTED NONE DETECTED   Tetrahydrocannabinol NONE DETECTED NONE DETECTED   Barbiturates NONE DETECTED NONE DETECTED    Comment: (NOTE) DRUG SCREEN FOR MEDICAL PURPOSES ONLY.  IF CONFIRMATION IS NEEDED FOR ANY PURPOSE, NOTIFY LAB WITHIN 5 DAYS. LOWEST DETECTABLE LIMITS FOR URINE DRUG SCREEN Drug Class                     Cutoff (ng/mL) Amphetamine and metabolites    1000 Barbiturate and metabolites    200 Benzodiazepine                 200 Tricyclics and metabolites     300 Opiates and metabolites        300 Cocaine and metabolites        300 THC                            50 Performed at [facility], [address] [place], [place] [address]    Ct Head Wo Contrast  Result Date: 09/12/2019 CLINICAL DATA:  Altered mental status EXAM: CT HEAD WITHOUT CONTRAST TECHNIQUE: Contiguous axial images were obtained from the base of the skull through the vertex without intravenous contrast. COMPARISON:  08/24/2019 FINDINGS:  Brain: No acute territorial infarction, hemorrhage, or intracranial mass. Mild encephalomalacia in the right temporal lobe consistent with prior infarct. Atrophy and mild small vessel ischemic changes of the white matter. Stable ventricle size. Vascular: No hyperdense vessels. Vertebral and carotid vascular calcification Skull: Normal. Negative for fracture or focal lesion. Sinuses/Orbits: Mucosal thickening in the frontal and ethmoid sinuses Other: None IMPRESSION: 1. No CT evidence for acute intracranial abnormality. 2. Atrophy and mild small vessel ischemic changes of the white matter Electronically Signed   By: Jasmine Pang M.D.   On: 09/12/2019 01:50   Mr Brain Wo Contrast  Result Date: 09/12/2019 CLINICAL DATA:  Altered mental status and hallucinations. EXAM: MRI HEAD WITHOUT CONTRAST TECHNIQUE: Multiplanar, multiecho pulse sequences of the brain and surrounding structures were obtained without intravenous contrast. COMPARISON:  Head CT 09/12/2019 FINDINGS: Brain: The study suffers from motion degradation. Diffusion imaging does not show evidence of a large acute or subacute infarction. A small insult may not be visible given this degree of motion degradation. No  focal lesion is seen affecting the brainstem or cerebellum. Cerebral hemispheres show generalized atrophy with chronic encephalomalacia in the right temporal lobe which could be due to old ischemic infarction or old trauma. There is an old infarction in the left basal ganglia/external capsule region. There are mild chronic small-vessel changes of the hemispheric white matter. No evidence of mass, hemorrhage, hydrocephalus or extra-axial collection. Vascular: Major vessels at the base of the brain show flow. Skull and upper cervical spine: Negative Sinuses/Orbits: Clear/normal Other: None IMPRESSION: Motion degraded examination. No acute or reversible finding identified. Generalized brain atrophy. Chronic encephalomalacia in the right temporal  lobe that could be due to old ischemic infarction or trauma. Old infarction in the left basal ganglia/external capsule. Mild chronic small-vessel change of the hemispheric white matter. Electronically Signed   By: Paulina Fusi M.D.   On: 09/12/2019 08:12   Dg Chest Port 1 View  Result Date: 09/12/2019 CLINICAL DATA:  Altered mental status EXAM: PORTABLE CHEST 1 VIEW COMPARISON:  Radiograph 08/24/2019 FINDINGS: There is a patchy opacity which partially silhouettes portion of the right heart border suspicious for right middle lobe consolidation. The heart is enlarged. The pulmonary vascularity remains well distributed. No pneumothorax or visible effusion. The osseous structures appear diffusely demineralized which may limit detection of small or nondisplaced fractures. Degenerative changes are present in the and imaged spine and shoulders. No acute osseous or soft tissue abnormality. IMPRESSION: 1. Findings suspicious for right middle lobe consolidation, possibly pneumonia 2. Recommend chest x-ray in 4-6 weeks to document resolution. 3. Cardiac enlargement from comparison portable radiograph, could reflect cardiomegaly or pericardial effusion. Electronically Signed   By: Kreg Shropshire M.D.   On: 09/12/2019 00:48    Pending Labs Unresulted Labs (From admission, onward)    Start     Ordered   09/12/19 0544  TSH  Once,   STAT     09/12/19 0543   09/12/19 0544  Ammonia  Once,   STAT     09/12/19 0543   09/12/19 0544  Ethanol  Once,   STAT     09/12/19 0543   09/12/19 0544  Comprehensive metabolic panel  Tomorrow morning,   R     09/12/19 0543   09/12/19 0544  CBC  Tomorrow morning,   R     09/12/19 0543   09/12/19 0544  Hemoglobin A1c  Once,   STAT     09/12/19 0543   09/12/19 0350  SARS CORONAVIRUS 2 (TAT 6-24 HRS) Nasopharyngeal Nasopharyngeal Swab  (Asymptomatic/Tier 2)  Once,   STAT    Question Answer Comment  Is this test for diagnosis or screening Screening   Symptomatic for COVID-19 as  defined by CDC No   Hospitalized for COVID-19 No   Admitted to ICU for COVID-19 No   Previously tested for COVID-19 No   Resident in a congregate (group) care setting No   Employed in healthcare setting No   Pregnant No      09/12/19 0349   09/12/19 0348  Urine culture  ONCE - STAT,   STAT     09/12/19 0347   09/12/19 0129  Lactic acid, plasma  Now then every 2 hours,   STAT     09/12/19 0130          Vitals/Pain Today's Vitals   09/12/19 0721 09/12/19 0722 09/12/19 0730 09/12/19 0830  BP: (!) 167/85  (!) 206/99 (!) 133/108  Pulse:      Resp:  16 (!) 23 Marland Kitchen)  24  Temp:      TempSrc:      SpO2:      PainSc:        Isolation Precautions No active isolations  Medications Medications  albuterol (PROVENTIL) (2.5 MG/3ML) 0.083% nebulizer solution 3 mL (has no administration in time range)  heparin injection 5,000 Units (has no administration in time range)  0.9 %  sodium chloride infusion (has no administration in time range)  cefTRIAXone (ROCEPHIN) 1 g in sodium chloride 0.9 % 100 mL IVPB (has no administration in time range)  cefTRIAXone (ROCEPHIN) 1 g in sodium chloride 0.9 % 100 mL IVPB (0 g Intravenous Stopped 09/12/19 0304)  azithromycin (ZITHROMAX) 500 mg in sodium chloride 0.9 % 250 mL IVPB (0 mg Intravenous Stopped 09/12/19 0334)    Mobility walks with device High fall risk   Focused Assessments Cardiac Assessment Handoff:    No results found for: CKTOTAL, CKMB, CKMBINDEX, TROPONINI No results found for: DDIMER Does the Patient currently have chest pain? No      R Recommendations: See Admitting Provider Note  Report given to:   Additional Notes:

## 2019-09-13 DIAGNOSIS — E876 Hypokalemia: Secondary | ICD-10-CM | POA: Diagnosis not present

## 2019-09-13 DIAGNOSIS — I4891 Unspecified atrial fibrillation: Secondary | ICD-10-CM | POA: Diagnosis not present

## 2019-09-13 DIAGNOSIS — I482 Chronic atrial fibrillation, unspecified: Secondary | ICD-10-CM | POA: Diagnosis present

## 2019-09-13 DIAGNOSIS — R410 Disorientation, unspecified: Secondary | ICD-10-CM

## 2019-09-13 DIAGNOSIS — K219 Gastro-esophageal reflux disease without esophagitis: Secondary | ICD-10-CM | POA: Diagnosis present

## 2019-09-13 DIAGNOSIS — Z20828 Contact with and (suspected) exposure to other viral communicable diseases: Secondary | ICD-10-CM | POA: Diagnosis present

## 2019-09-13 DIAGNOSIS — F05 Delirium due to known physiological condition: Secondary | ICD-10-CM

## 2019-09-13 DIAGNOSIS — N179 Acute kidney failure, unspecified: Secondary | ICD-10-CM | POA: Diagnosis present

## 2019-09-13 DIAGNOSIS — I119 Hypertensive heart disease without heart failure: Secondary | ICD-10-CM | POA: Diagnosis present

## 2019-09-13 DIAGNOSIS — Z7902 Long term (current) use of antithrombotics/antiplatelets: Secondary | ICD-10-CM | POA: Diagnosis not present

## 2019-09-13 DIAGNOSIS — R441 Visual hallucinations: Secondary | ICD-10-CM | POA: Diagnosis present

## 2019-09-13 DIAGNOSIS — R4182 Altered mental status, unspecified: Secondary | ICD-10-CM | POA: Diagnosis present

## 2019-09-13 DIAGNOSIS — J690 Pneumonitis due to inhalation of food and vomit: Secondary | ICD-10-CM | POA: Diagnosis present

## 2019-09-13 DIAGNOSIS — B9620 Unspecified Escherichia coli [E. coli] as the cause of diseases classified elsewhere: Secondary | ICD-10-CM | POA: Diagnosis present

## 2019-09-13 DIAGNOSIS — R443 Hallucinations, unspecified: Secondary | ICD-10-CM | POA: Diagnosis not present

## 2019-09-13 DIAGNOSIS — Z79899 Other long term (current) drug therapy: Secondary | ICD-10-CM | POA: Diagnosis not present

## 2019-09-13 DIAGNOSIS — R4781 Slurred speech: Secondary | ICD-10-CM | POA: Diagnosis not present

## 2019-09-13 DIAGNOSIS — J189 Pneumonia, unspecified organism: Secondary | ICD-10-CM | POA: Diagnosis not present

## 2019-09-13 DIAGNOSIS — Z880 Allergy status to penicillin: Secondary | ICD-10-CM | POA: Diagnosis not present

## 2019-09-13 DIAGNOSIS — R509 Fever, unspecified: Secondary | ICD-10-CM | POA: Diagnosis not present

## 2019-09-13 DIAGNOSIS — M25571 Pain in right ankle and joints of right foot: Secondary | ICD-10-CM | POA: Diagnosis not present

## 2019-09-13 DIAGNOSIS — E785 Hyperlipidemia, unspecified: Secondary | ICD-10-CM | POA: Diagnosis present

## 2019-09-13 DIAGNOSIS — M109 Gout, unspecified: Secondary | ICD-10-CM | POA: Diagnosis present

## 2019-09-13 DIAGNOSIS — R7989 Other specified abnormal findings of blood chemistry: Secondary | ICD-10-CM | POA: Diagnosis present

## 2019-09-13 DIAGNOSIS — R32 Unspecified urinary incontinence: Secondary | ICD-10-CM | POA: Diagnosis present

## 2019-09-13 DIAGNOSIS — R778 Other specified abnormalities of plasma proteins: Secondary | ICD-10-CM

## 2019-09-13 DIAGNOSIS — R451 Restlessness and agitation: Secondary | ICD-10-CM | POA: Diagnosis not present

## 2019-09-13 DIAGNOSIS — I440 Atrioventricular block, first degree: Secondary | ICD-10-CM | POA: Diagnosis not present

## 2019-09-13 DIAGNOSIS — Z791 Long term (current) use of non-steroidal anti-inflammatories (NSAID): Secondary | ICD-10-CM | POA: Diagnosis not present

## 2019-09-13 DIAGNOSIS — F0390 Unspecified dementia without behavioral disturbance: Secondary | ICD-10-CM | POA: Diagnosis present

## 2019-09-13 DIAGNOSIS — Z8673 Personal history of transient ischemic attack (TIA), and cerebral infarction without residual deficits: Secondary | ICD-10-CM | POA: Diagnosis not present

## 2019-09-13 DIAGNOSIS — N390 Urinary tract infection, site not specified: Secondary | ICD-10-CM | POA: Diagnosis present

## 2019-09-13 LAB — URINE CULTURE: Culture: 40000 — AB

## 2019-09-13 LAB — BASIC METABOLIC PANEL
Anion gap: 17 — ABNORMAL HIGH (ref 5–15)
BUN: 11 mg/dL (ref 8–23)
CO2: 20 mmol/L — ABNORMAL LOW (ref 22–32)
Calcium: 8.9 mg/dL (ref 8.9–10.3)
Chloride: 104 mmol/L (ref 98–111)
Creatinine, Ser: 1.08 mg/dL — ABNORMAL HIGH (ref 0.44–1.00)
GFR calc Af Amer: 53 mL/min — ABNORMAL LOW (ref >60)
GFR calc non Af Amer: 45 mL/min — ABNORMAL LOW (ref >60)
Glucose, Bld: 107 mg/dL — ABNORMAL HIGH (ref 70–99)
Potassium: 3.6 mmol/L (ref 3.5–5.1)
Sodium: 141 mmol/L (ref 135–145)

## 2019-09-13 NOTE — TOC Initial Note (Signed)
Transition of Care [facility]) - Initial/Assessment Note    Patient Details  Name: Mary Chavez MRN: [id number] Date of Birth: 1928/04/27  [facility]) CM/SW Contact:    Benard Halsted, LCSW Phone Number: 09/13/2019, 6:46 PM  Clinical Narrative:                 CSW received consult for possible SNF placement at time of discharge. CSW spoke with patient's daughter in law regarding PT recommendation of SNF placement at time of discharge. Patient's DIL reported that patient lives with her and patient's son and they are currently unable to care for patient at their home given patient's current physical needs and fall risk. Patient's DIL expressed understanding of PT recommendation and is agreeable to SNF placement at time of discharge, but is going to discuss it with patient's son. CSW discussed insurance authorization process and provided Medicare SNF ratings list. Patient's DIL expressed being hopeful for rehab and for patient to feel better soon. No further questions reported at this time. CSW to continue to follow and assist with discharge planning needs.   Expected Discharge Plan: Skilled Nursing Facility Barriers to Discharge: Insurance Authorization, Continued Medical Work up   Patient Goals and CMS Choice Patient states their goals for this hospitalization and ongoing recovery are:: Get stronger CMS Medicare.gov Compare Post Acute Care list provided to:: Patient Represenative (must comment)(Son and daughter in law) Choice offered to / list presented to : Adult Children  Expected Discharge Plan and Services Expected Discharge Plan: [place] In-house Referral: Clinical Social Work Discharge Planning Services: CM Consult Post Acute Care Choice: [place] arrangements for the past 2 months: Apartment                   DME Agency: (owns walker , W/C)         Comfort Agency: NA        Prior Living Arrangements/Services Living  arrangements for the past 2 months: Apartment Lives with:: Adult Children Patient language and need for interpreter reviewed:: Yes Do you feel safe going back to the place where you live?: Yes      Need for Family Participation in Patient Care: Yes (Comment) Care giver support system in place?: Yes (comment)   Criminal Activity/Legal Involvement Pertinent to Current Situation/Hospitalization: No - Comment as needed  Activities of Daily Living      Permission Sought/Granted Permission sought to share information with : Facility Sport and exercise psychologist, Family Supports Permission granted to share information with : No  Share Information with NAME: Charlett Nose  Permission granted to share info w AGENCY: SNFs  Permission granted to share info w Relationship: Daughter in law  Permission granted to share info w Contact Information: [phone]  Emotional Assessment Appearance:: Appears stated age Attitude/Demeanor/Rapport: Unable to Assess Affect (typically observed): Unable to Assess Orientation: : Oriented to Self Alcohol / Substance Use: Not Applicable Psych Involvement: No (comment)  Admission diagnosis:  Confusion [R41.0] Visual hallucinations [R44.1] Elevated troponin [R77.8] Acute kidney injury (nontraumatic) (HCC) [N17.9] Urinary tract infection without hematuria, site unspecified [N39.0] Atrial fibrillation, unspecified type ([place]) [I48.91] Community acquired pneumonia of right middle lobe of lung [J18.9] Patient Active Problem List   Diagnosis Date Noted  . Elevated troponin   . Confusion   . AMS (altered mental status) 09/12/2019  . Acute kidney injury (nontraumatic) ([place])   . Atrial fibrillation ([place])   . Urinary tract infection without hematuria   . Hallucinations   .  Stroke (HCC) 09/02/2019  . Microcytic anemia 08/09/2019  . Encounter to establish care with new doctor 07/27/2019  . Essential hypertension 07/27/2019  . GERD (gastroesophageal reflux disease)  07/27/2019  . Hyperlipidemia 07/27/2019  . Urinary incontinence in female 07/27/2019  . Poor appetite 07/27/2019   PCP:  Allayne Stack, DO Pharmacy:   [facility] [phone] Ginette Otto, [place] - [address] AT [facility] [address] [place] [address] Phone: [phone] Fax: [phone]     Social Determinants of Health (SDOH) Interventions    Readmission Risk Interventions No flowsheet data found.

## 2019-09-13 NOTE — Progress Notes (Signed)
Patient had little to no PO intake today. Refusing the meals. Confusion continues to worsen throughout shift.

## 2019-09-13 NOTE — Progress Notes (Signed)
  Speech Language Pathology Treatment: Dysphagia  Patient Details Name: Mary Chavez MRN: [id number] DOB: [birth year] Today's Date: 09/13/2019 Time: 1152-1205 SLP Time Calculation (min) (ACUTE ONLY): 13 min  Assessment / Plan / Recommendation Clinical Impression  Pt continues to present with cognitive feeding deficits per today's tx session.  Pt appeared more altered today in comparison to yesterday's evaluation, and she exhibited increased confusion and neologisms during conversational speech.  Pt was observed with trials of thin liquid and puree.  Pt was unable to feed herself despite max cues and she required total assistance for po intake.  She exhibited poor bolus acceptance across trials with decreased labial closure and reduced lingual manipulation.  Pt attempted to converse with SLP when bolus was in her oral cavity despite cues, which elevated her risk of aspiration.  Additionally suspect delayed swallow initiation with all consistencies.  Attempted soft solid trial; however pt did not attempt mastication or lingual manipulation; therefore it was removed from her oral cavity secondary to aspiration risk.  Despite this presentation, pt did not exhibit clinical s/sx of aspiration with any consistencies evaluated.  Recommend diet change to Dysphagia 1 (puree) solids and continuation of thin liquid (no straws) with full supervision to assist with po intake and to encourage use of compensatory strategies.       HPI HPI: Pt is a 85 Y/O F with PMX of  HTN, HLD, CVA brought in by the family for change in mental status and Hallucination, which started a few hours before presentation. Denies LOC, no fall, no facial asymmetry, no limb weakness.  Head CT and Brain MRI were clear for acute abnormalities, but revealed "chronic encephalomalacia" and "generalized brain atrophy".  CXR on 10/12 reported: "Findings suspicious for right middle lobe consolidation, possibly pneumonia".  No documented dysphagia hx.        SLP Plan  Continue with current plan of care       Recommendations  Diet recommendations: Dysphagia 1 (puree);Thin liquid Liquids provided via: Cup;No straw;Teaspoon Medication Administration: Crushed with puree Supervision: Full supervision/cueing for compensatory strategies;Staff to assist with self feeding Compensations: Minimize environmental distractions;Slow rate;Small sips/bites Postural Changes and/or Swallow Maneuvers: Seated upright 90 degrees                Oral Care Recommendations: Oral care BID Follow up Recommendations: 24 hour supervision/assistance;Skilled Nursing facility SLP Visit Diagnosis: Dysphagia, unspecified (R13.10) Plan: Continue with current plan of care       Bretta Bang, M.S., [place] Office: [phone]      [place] 09/13/2019, 1:52 PM

## 2019-09-13 NOTE — Progress Notes (Signed)
Family Medicine Teaching Service Daily Progress Note Intern Pager: [phone]  Patient name: Mary Chavez Medical record number: [id number] Date of birth: 1928-05-09 Age: 85 y.o. Gender: female  Primary Care Provider: Patriciaann Clan, DO Consultants:  Code Status: Full Code   Pt Overview and Major Events to Date:  10/12- admitted for AMS and hallucinations, treated for ?UTI with CTX 10/13- consult placed for psychiatry evaluation   Assessment and Plan: Mary Chavez is a 85 y.o. female presenting with altered mental status and hallucinations. PMH is significant for hypertension, GERD, hyperlipidemia, urinary incontinence, history of stroke, and poor appetite.  Altered mental status, hallucinations Presentation is similar to that of alzheimer's patients with conversations that seem to differ in time/space. Patient is able to state that she is in the hospital and that she is in [place] and also denies any hallucinations during my physical exam. Nursing notes state that patient continues to have visual hallucinations.  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  We will work to obtain previous medical records for review in order to find potential causes for acute onset of altered mental status. -holding mirtazepine in case it is contributing to AMS but being that patient took this med 5 days ago, we have lower suspicion that this is the cause for her mental status change  -consult psychiatry for evaluation   Possible history of atrial fibrillation Will refer to previous medical records for note of whether patient has known AF. Telemetry reports  -continue cardiac monitoring  AKI AKI creatinine 1.39 on admission -will recheck Cr and continue to monitor with BMP   -avoid nephrotoxic agents   Questionable pneumonia Patient had chest x-ray on admission with possible right middle lobe pneumonia however patient has been asymptomatic without respiratory symptoms or abnormal lung sounds on exam and stable on room air.  Patient received azithromycin and ceftriaxone in the ED -Would not continue antibiotics at this time -Continue to monitor respiratory status with vitals -Follow-up chest x-ray in 4 to 6 weeks to confirm resolution of noted findings  Hypertension Patient hypertensive intermittently overnight with diastolic it maximum of 737.  Blood pressure seems to have normalized 135/87 most recently.  Patient home medications appear to include ZESTORETIC, we are holding at this time as patient had elevated creatinine on admission. -Continue to monitor blood pressure with vitals -We will plan to restart antihypertensive medication upon discharge with resolution of AKI  Patient with QTC prolongation of 516 -Holding QTC prolonging medications -Patient on cardiac monitoring  History of a fall Patient is reported to have had a fall while living in [place] and has residual right lower extremity weakness -Patient on fall precautions -PT/OT recommending SNF upon discharge  GERD Home medications include Protonix 20 and Pepcid 20 -Patient to continue  FEN/GI: Dysphagia diet 2 PPx: Heparin 5000 units  Disposition: PT/OT recommending SNF upon discharge  Subjective:  Patient reports some pain with palpation of suprapubic area, denies pain elsewhere. Patient denies hallucinations while I was in the room.   Objective: Temp:  [98.1 F (36.7 C)-99.8 F (37.7 C)] 99.8 F (37.7 C) (10/13 1232) Pulse Rate:  [69-144] 126 (10/13 1232) Resp:  [18-28] 28 (10/13 1232) BP: (123-159)/(87-141) 159/141 (10/13 1235) SpO2:  [97 %-100 %] 100 % (10/13 1062)  Physical Exam: General: elderly female  lying in bed in NAD, pulling at bed sheets  Cardiovascular: RRR without  murmurs gallops or friction rubs  Respiratory: CTAB without wheezing or increased WOB  Abdomen: suprapubic tenderness  Extremities: no lower extremity edema   Laboratory: Recent Labs  Lab 09/12/19 0200 09/12/19 0926  WBC 8.0 8.5  HGB 12.9 12.2  HCT 40.5 39.2  PLT 193 219   Recent Labs  Lab 09/12/19 0200 09/12/19 0926  NA 142 143  K 3.8 3.3*  CL 106 106  CO2 24 24  BUN 23 22  CREATININE 1.39* 1.18*  CALCIUM 9.9 9.8  PROT 7.9 7.3  BILITOT 0.9 0.9  ALKPHOS 57 52  ALT 12 12  AST 25 27  GLUCOSE 113* 113*    Imaging/Diagnostic Tests: No new imaging   Nicki Gu[place], MD 09/13/2019, 1:33 PM PGY-1, [facility] FPTS Intern pager: [phone], text pages welcome

## 2019-09-13 NOTE — Progress Notes (Signed)
Patient remains confused alert to self only.Patient hallucinating seeing babies and other people in room and having conversations with them.Attempt to re orient patient but unsuccessful.

## 2019-09-13 NOTE — Progress Notes (Signed)
This nurse was not informed by nurse tech of temperature 102.4 at 2035 tonight.Patient temperature rechecked down 99.1 but blood pressure 151/105 heart rate 105 remains atrial fibrillation.Dr.Welborn notified.No new orders will continue to monitor.

## 2019-09-13 NOTE — Consult Note (Signed)
[facility] Face-to-Face Psychiatry Consult   Reason for Consult:  Hallucinations  Referring Physician:  Dr Linwood Dibbles Patient Identification: Mary Chavez MRN:  [id number] Principal Diagnosis: AMS Diagnosis:  Active Problems:   AMS (altered mental status)   Hallucinations   Elevated troponin   Total Time spent with patient: 1 hour  Subjective:   Mary Chavez is a 85 y.o. female patient admitted with AMS.  HPI: Patient admitted for altered mental status and hallucinations.  She did have a urinary tract infection that was treated on 923 with urinalysis results with small amount of leukocytes and few bacteria remaining, elevated white blood count.  Her family reports she moved from [place] to [place] in July to live with her son and daughter-in-law.  Recently she started having hallucinations of seeing people at night and now has periods of not recognizing her daughter-in-law.  Family reports this is a sudden change.  Most likely facilitated by the urinary tract infection.  Symptoms should wane as UTI resolves.  Meanwhile, low dose of Risperdal can help with her hallucinations or Seroquel if she needs sleep.  Past Psychiatric History: none  Risk to Self:  no suicidal risk Risk to Others:  none Prior Inpatient Therapy:  none Prior Outpatient Therapy:  none  Past Medical History:  Past Medical History:  Diagnosis Date  . Hypertension    History reviewed. No pertinent surgical history. Family History: History reviewed. No pertinent family history. Family Psychiatric  History: none Social History:  Social History   Substance and Sexual Activity  Alcohol Use Not Currently     Social History   Substance and Sexual Activity  Drug Use Not Currently    Social History   Socioeconomic History  . Marital status: Divorced    Spouse name: Not on file  . Number of children: Not on file  . Years of education: Not on file  . Highest education level: Not on file  Occupational History   . Not on file  Social Needs  . Financial resource strain: Not on file  . Food insecurity    Worry: Not on file    Inability: Not on file  . Transportation needs    Medical: Not on file    Non-medical: Not on file  Tobacco Use  . Smoking status: Never Smoker  . Smokeless tobacco: Never Used  Substance and Sexual Activity  . Alcohol use: Not Currently  . Drug use: Not Currently  . Sexual activity: Not on file  Lifestyle  . Physical activity    Days per week: Not on file    Minutes per session: Not on file  . Stress: Not on file  Relationships  . Social Musician on phone: Not on file    Gets together: Not on file    Attends religious service: Not on file    Active member of club or organization: Not on file    Attends meetings of clubs or organizations: Not on file    Relationship status: Not on file  Other Topics Concern  . Not on file  Social History Narrative  . Not on file   Additional Social History:    Allergies:   Allergies  Allergen Reactions  . Penicillins Swelling and Other (See Comments)    Did it involve swelling of the face/tongue/throat, SOB, or low BP? No- body became swollen, no breathing issues, though Did it involve sudden or severe rash/hives, skin peeling, or any reaction on the inside of  your mouth or nose? No Did you need to seek medical attention at a hospital or doctor's office? Unk When did it last happen? "A long time ago" If all above answers are "NO", may proceed with cephalosporin use.     Labs:  Results for orders placed or performed during the hospital encounter of 09/12/19 (from the past 48 hour(s))  Comprehensive metabolic panel     Status: Abnormal   Collection Time: 09/12/19  2:00 AM  Result Value Ref Range   Sodium 142 135 - 145 mmol/L   Potassium 3.8 3.5 - 5.1 mmol/L   Chloride 106 98 - 111 mmol/L   CO2 24 22 - 32 mmol/L   Glucose, Bld 113 (H) 70 - 99 mg/dL   BUN 23 8 - 23 mg/dL   Creatinine, Ser 7.85 (H)  0.44 - 1.00 mg/dL   Calcium 9.9 8.9 - 88.5 mg/dL   Total Protein 7.9 6.5 - 8.1 g/dL   Albumin 3.7 3.5 - 5.0 g/dL   AST 25 15 - 41 U/L   ALT 12 0 - 44 U/L   Alkaline Phosphatase 57 38 - 126 U/L   Total Bilirubin 0.9 0.3 - 1.2 mg/dL   GFR calc non Af Amer 34 (L) >60 mL/min   GFR calc Af Amer 39 (L) >60 mL/min   Anion gap 12 5 - 15    Comment: Performed at [facility], [address] [place], [place] [address]  Troponin I (High Sensitivity)     Status: Abnormal   Collection Time: 09/12/19  2:00 AM  Result Value Ref Range   Troponin I (High Sensitivity) 51 (H) <18 ng/L    Comment: (NOTE) Elevated high sensitivity troponin I (hsTnI) values and significant  changes across serial measurements may suggest ACS but many other  chronic and acute conditions are known to elevate hsTnI results.  Refer to the "Links" section for chest pain algorithms and additional  guidance. Performed at [facility], [address], [place], [place] [address]   CBC with Differential     Status: Abnormal   Collection Time: 09/12/19  2:00 AM  Result Value Ref Range   WBC 8.0 4.0 - 10.5 K/uL   RBC 4.86 3.87 - 5.11 MIL/uL   Hemoglobin 12.9 12.0 - 15.0 g/dL   HCT 67.6 72.0 - 94.7 %   MCV 83.3 80.0 - 100.0 fL   MCH 26.5 26.0 - 34.0 pg   MCHC 31.9 30.0 - 36.0 g/dL   RDW 09.6 (H) 28.3 - 66.2 %   Platelets 193 150 - 400 K/uL   nRBC 0.0 0.0 - 0.2 %   Neutrophils Relative % 51 %   Neutro Abs 4.1 1.7 - 7.7 K/uL   Lymphocytes Relative 36 %   Lymphs Abs 2.9 0.7 - 4.0 K/uL   Monocytes Relative 8 %   Monocytes Absolute 0.6 0.1 - 1.0 K/uL   Eosinophils Relative 3 %   Eosinophils Absolute 0.2 0.0 - 0.5 K/uL   Basophils Relative 2 %   Basophils Absolute 0.1 0.0 - 0.1 K/uL   Immature Granulocytes 0 %   Abs Immature Granulocytes 0.02 0.00 - 0.07 K/uL    Comment: Performed at [facility], [address], [place], [place] [address]  Culture, blood (routine x 2)     Status: None (Preliminary  result)   Collection Time: 09/12/19  2:00 AM   Specimen: BLOOD LEFT ARM  Result Value Ref Range   Specimen Description  BLOOD LEFT ARM    Special Requests      BOTTLES DRAWN AEROBIC AND ANAEROBIC Blood Culture results may not be optimal due to an inadequate volume of blood received in culture bottles   Culture      NO GROWTH 1 DAY Performed at [facility], [address], [place], [place] [address]    Report Status PENDING   Lactic acid, plasma     Status: None   Collection Time: 09/12/19  2:00 AM  Result Value Ref Range   Lactic Acid, Venous 1.4 0.5 - 1.9 mmol/L    Comment: Performed at [facility], [address], [place], [place] [address]  Culture, blood (routine x 2)     Status: None (Preliminary result)   Collection Time: 09/12/19  2:10 AM   Specimen: BLOOD RIGHT ARM  Result Value Ref Range   Specimen Description BLOOD RIGHT ARM    Special Requests      BOTTLES DRAWN AEROBIC AND ANAEROBIC Blood Culture adequate volume   Culture      NO GROWTH 1 DAY Performed at [facility], [address], [place], [place] [address]    Report Status PENDING   Urinalysis, Routine w reflex microscopic     Status: Abnormal   Collection Time: 09/12/19  3:07 AM  Result Value Ref Range   Color, Urine YELLOW YELLOW   APPearance CLOUDY (A) CLEAR   Specific Gravity, Urine 1.021 1.005 - 1.030   pH 6.0 5.0 - 8.0   Glucose, UA NEGATIVE NEGATIVE mg/dL   Hgb urine dipstick NEGATIVE NEGATIVE   Bilirubin Urine NEGATIVE NEGATIVE   Ketones, ur NEGATIVE NEGATIVE mg/dL   Protein, ur 30 (A) NEGATIVE mg/dL   Nitrite POSITIVE (A) NEGATIVE   Leukocytes,Ua SMALL (A) NEGATIVE   RBC / HPF 0-5 0 - 5 RBC/hpf   WBC, UA 6-10 0 - 5 WBC/hpf   Bacteria, UA FEW (A) NONE SEEN   Squamous Epithelial / LPF 6-10 0 - 5   Hyaline Casts, UA PRESENT     Comment: Performed at [facility], [address], [place], [place] [address]  SARS CORONAVIRUS 2 (TAT 6-24 HRS) Nasopharyngeal Nasopharyngeal  Swab     Status: None   Collection Time: 09/12/19  4:35 AM   Specimen: Nasopharyngeal Swab  Result Value Ref Range   SARS Coronavirus 2 NEGATIVE NEGATIVE    Comment: (NOTE) SARS-CoV-2 target nucleic acids are NOT DETECTED. The SARS-CoV-2 RNA is generally detectable in upper and lower respiratory specimens during the acute phase of infection. Negative results do not preclude SARS-CoV-2 infection, do not rule out co-infections with other pathogens, and should not be used as the sole basis for treatment or other patient management decisions. Negative results must be combined with clinical observations, patient history, and epidemiological information. The expected result is Negative. Fact Sheet for Patients: HairSlick.no Fact Sheet for Healthcare Providers: quierodirigir.com This test is not yet approved or cleared by the [place] FDA and  has been authorized for detection and/or diagnosis of SARS-CoV-2 by FDA under an Emergency Use Authorization (EUA). This EUA will remain  in effect (meaning this test can be used) for the duration of the COVID-19 declaration under Section 56 4(b)(1) of the Act, 21 U.S.C. section 360bbb-3(b)(1), unless the authorization is terminated or revoked sooner. Performed at [facility], [address] [place], [place] [address]   Urine culture     Status: Abnormal   Collection Time:  09/12/19  5:23 AM   Specimen: Urine, Random  Result Value Ref Range   Specimen Description URINE, RANDOM    Special Requests      NONE Performed at [facility], [address] [place], [place] [address]    Culture 40,000 COLONIES/mL ESCHERICHIA COLI (A)    Report Status 09/13/2019 FINAL    Organism ID, Bacteria ESCHERICHIA COLI (A)       Susceptibility   Escherichia coli - MIC*    AMPICILLIN <=2 SENSITIVE Sensitive     CEFAZOLIN <=4 SENSITIVE Sensitive     CEFTRIAXONE <=1 SENSITIVE Sensitive      CIPROFLOXACIN <=0.25 SENSITIVE Sensitive     GENTAMICIN <=1 SENSITIVE Sensitive     IMIPENEM <=0.25 SENSITIVE Sensitive     NITROFURANTOIN <=16 SENSITIVE Sensitive     TRIMETH/SULFA <=20 SENSITIVE Sensitive     AMPICILLIN/SULBACTAM <=2 SENSITIVE Sensitive     PIP/TAZO <=4 SENSITIVE Sensitive     Extended ESBL NEGATIVE Sensitive     * 40,000 COLONIES/mL ESCHERICHIA COLI  Ammonia     Status: None   Collection Time: 09/12/19  5:44 AM  Result Value Ref Range   Ammonia 26 9 - 35 umol/L    Comment: Performed at [facility], [address] [place], [place] [address]  Urine rapid drug screen (hosp performed)     Status: None   Collection Time: 09/12/19  7:26 AM  Result Value Ref Range   Opiates NONE DETECTED NONE DETECTED   Cocaine NONE DETECTED NONE DETECTED   Benzodiazepines NONE DETECTED NONE DETECTED   Amphetamines NONE DETECTED NONE DETECTED   Tetrahydrocannabinol NONE DETECTED NONE DETECTED   Barbiturates NONE DETECTED NONE DETECTED    Comment: (NOTE) DRUG SCREEN FOR MEDICAL PURPOSES ONLY.  IF CONFIRMATION IS NEEDED FOR ANY PURPOSE, NOTIFY LAB WITHIN 5 DAYS. LOWEST DETECTABLE LIMITS FOR URINE DRUG SCREEN Drug Class                     Cutoff (ng/mL) Amphetamine and metabolites    1000 Barbiturate and metabolites    200 Benzodiazepine                 200 Tricyclics and metabolites     300 Opiates and metabolites        300 Cocaine and metabolites        300 THC                            50 Performed at [facility], [address] [place], [place] [address]   Lactic acid, plasma     Status: None   Collection Time: 09/12/19  9:20 AM  Result Value Ref Range   Lactic Acid, Venous 1.5 0.5 - 1.9 mmol/L    Comment: Performed at [facility], [address] [place], [place] [address]  Comprehensive metabolic panel     Status: Abnormal   Collection Time: 09/12/19  9:26 AM  Result Value Ref Range   Sodium 143 135 - 145 mmol/L   Potassium 3.3 (L) 3.5 - 5.1  mmol/L   Chloride 106 98 - 111 mmol/L   CO2 24 22 - 32 mmol/L   Glucose, Bld 113 (H) 70 - 99 mg/dL   BUN 22 8 - 23 mg/dL   Creatinine, Ser 4.741.18 (H) 0.44 - 1.00 mg/dL   Calcium 9.8 8.9 - 25.910.3 mg/dL   Total Protein  7.3 6.5 - 8.1 g/dL   Albumin 3.5 3.5 - 5.0 g/dL   AST 27 15 - 41 U/L   ALT 12 0 - 44 U/L   Alkaline Phosphatase 52 38 - 126 U/L   Total Bilirubin 0.9 0.3 - 1.2 mg/dL   GFR calc non Af Amer 41 (L) >60 mL/min   GFR calc Af Amer 47 (L) >60 mL/min   Anion gap 13 5 - 15    Comment: Performed at [place] [address], [place], [place] [address]  CBC     Status: Abnormal   Collection Time: 09/12/19  9:26 AM  Result Value Ref Range   WBC 8.5 4.0 - 10.5 K/uL   RBC 4.70 3.87 - 5.11 MIL/uL   Hemoglobin 12.2 12.0 - 15.0 g/dL   HCT 39.2 36.0 - 46.0 %   MCV 83.4 80.0 - 100.0 fL   MCH 26.0 26.0 - 34.0 pg   MCHC 31.1 30.0 - 36.0 g/dL   RDW 18.1 (H) 11.5 - 15.5 %   Platelets 219 150 - 400 K/uL   nRBC 0.0 0.0 - 0.2 %    Comment: Performed at [facility], [place] [address] [place], [place] [address]  Troponin I (High Sensitivity)     Status: Abnormal   Collection Time: 09/12/19  9:26 AM  Result Value Ref Range   Troponin I (High Sensitivity) 46 (H) <18 ng/L    Comment: (NOTE) Elevated high sensitivity troponin I (hsTnI) values and significant  changes across serial measurements may suggest ACS but many other  chronic and acute conditions are known to elevate hsTnI results.  Refer to the "Links" section for chest pain algorithms and additional  guidance. Performed at [facility], [place] [address] [place], [place] [address]   Hemoglobin A1c     Status: None   Collection Time: 09/12/19  9:27 AM  Result Value Ref Range   Hgb A1c MFr Bld 5.2 4.8 - 5.6 %    Comment: (NOTE) Pre diabetes:          5.7%-6.4% Diabetes:              >6.4% Glycemic control for   <7.0% adults with diabetes    Mean Plasma Glucose 102.54 mg/dL    Comment: Performed at [place] [address] [place], [place] [address]  TSH     Status: None   Collection Time: 09/12/19  9:28 AM  Result Value Ref Range   TSH 3.799 0.350 - 4.500 uIU/mL    Comment: Performed by a 3rd Generation assay with a functional sensitivity of <=0.01 uIU/mL. Performed at [facility], [place] [address], [place], [place] [address]   Ethanol     Status: None   Collection Time: 09/12/19  9:28 AM  Result Value Ref Range   Alcohol, Ethyl (B) <10 <10 mg/dL    Comment: (NOTE) Lowest detectable limit for serum alcohol is 10 mg/dL. For medical purposes only. Performed at [facility], [place] [address] [place], [place] [address]   Basic metabolic panel     Status: Abnormal   Collection Time: 09/13/19  1:46 PM  Result Value Ref Range   Sodium 141 135 - 145 mmol/L   Potassium 3.6 3.5 - 5.1 mmol/L   Chloride 104 98 - 111 mmol/L   CO2 20 (L) 22 - 32 mmol/L   Glucose, Bld 107 (H) 70 - 99 mg/dL   BUN  11 8 - 23 mg/dL   Creatinine, Ser 1.61 (H) 0.44 - 1.00 mg/dL   Calcium 8.9 8.9 - 09.6 mg/dL   GFR calc non Af Amer 45 (L) >60 mL/min   GFR calc Af Amer 53 (L) >60 mL/min   Anion gap 17 (H) 5 - 15    Comment: Performed at [facility], [address] [place], [place] [address]    Current Facility-Administered Medications  Medication Dose Route Frequency Provider Last Rate Last Dose  . 0.9 %  sodium chloride infusion   Intravenous Continuous Jackelyn Poling, DO 115 mL/hr at 09/13/19 1822    . albuterol (PROVENTIL) (2.5 MG/3ML) 0.083% nebulizer solution 3 mL  3 mL Inhalation Q6H PRN Jackelyn Poling, DO      . cefTRIAXone (ROCEPHIN) 1 g in sodium chloride 0.9 % 100 mL IVPB  1 g Intravenous Q24H Ellwood Dense, DO 200 mL/hr at 09/13/19 0527 1 g at 09/13/19 0527  . heparin injection 5,000 Units  5,000 Units Subcutaneous Q8H WelbornAlycia Rossetti, DO   5,000 Units at 09/13/19 9811    Musculoskeletal: Strength & Muscle Tone: decreased Gait & Station: did not witness Patient leans:  N/A  Psychiatric Specialty Exam: Physical Exam  Nursing note and vitals reviewed. Constitutional: She appears well-developed and well-nourished.  HENT:  Head: Normocephalic.  Neck: Normal range of motion.  Respiratory: Effort normal.  Neurological: She is alert.  Psychiatric: Her speech is normal and behavior is normal. Judgment and thought content normal. Her mood appears anxious. Her affect is blunt. Cognition and memory are impaired.    Review of Systems  Psychiatric/Behavioral: Positive for memory loss. The patient is nervous/anxious.   All other systems reviewed and are negative.   Blood pressure (!) 159/141, pulse (!) 126, temperature 99.8 F (37.7 C), temperature source Oral, resp. rate 18, height  (1.626 m), SpO2 100 %.Body mass index is 28.46 kg/m.  General Appearance: Disheveled  Eye Contact:  Fair  Speech:  Slurred and no teeth  Volume:  Normal  Mood:  Anxious  Affect:  Blunt  Thought Process:  Irrelevant  Orientation:  Other:  person  Thought Content:  difficult to assess due to inability to understand what the patient is saying  Suicidal Thoughts:  No  Homicidal Thoughts:  No  Memory:  Immediate;   Poor Recent;   Poor Remote;   Poor  Judgement:  Impaired  Insight:  Lacking  Psychomotor Activity:  Decreased  Concentration:  Concentration: Fair and Attention Span: Fair  Recall:  Poor  Fund of Knowledge:  Fair  Language:  Poor  Akathisia:  No  Handed:  Right  AIMS (if indicated):     Assets:  Leisure Time Resilience Social Support  ADL's:  Impaired  Cognition:  Impaired,  Moderate  Sleep:        Treatment Plan Summary: Delirium: Patient likely has ongoing delirium which presents as fluctuating cognition.    The patient's exam is notable for altered sensorium, perceptual disturbances, disorientation and cognitive deficits that appear markedly different than their baseline, suggesting a diagnosis of delirium.  Virtually any medical condition or  physiologic stress can precipitate delirium in a susceptible individual, with risk increasing in those with: advanced age, sensory impairments, organic brain disease (stroke, dementia, Parkinsons), psychiatric illness, major chronic medical issues, prolonged hospitalizations, postoperative status, anemia, insomnia/disturbed sleep, and severe pain. Addressing the underlying medical condition and institution of preventative measures are recommended.  - Continue to monitor and treat underlying medical causes  of delirium, including infection, electrolyte disturbances, etc. - Delirium precautions - Minimize/avoid deliriogenic meds including:      anticholinergic, opiates, benzodiazepines           - Maintain hydration, oxygenation, nutrition           - Limit use of restraints and catheters           - Normalize sleep patterns by minimizing                    nighttime noise, light and interruptions by     -Ensure sleep apnea treatment is provided               overnight.             -clustering care, opening blinds during the day           - Reorient the patient frequently, provide easily              visible clock and calendar           - Provide sensory aids like glasses, hearing aids           - Encourage ambulation, regular activities and               visitors to maintain cognitive stimulation   -Patient would benefit from having family              members at bedside to reinforce her              Orientation. -Start Risperdal 0.25 mg BID for hallucinations until UTI is clear or Seroquel 12.5 mg BID if sleep is an issue  Disposition: No evidence of imminent risk to self or others at present.   Supportive therapy provided about ongoing stressors.  Nanine Means, NP 09/13/2019 6:38 PM

## 2019-09-13 NOTE — NC FL2 (Signed)
  [place] LEVEL OF CARE SCREENING TOOL     IDENTIFICATION  Patient Name: Mary Chavez Birthdate: 1928/08/26 Sex: female Admission Date (Current Location): 09/12/2019  [facility] and [place] Number:  Herbalist and Address:  [facility], [place] [address], [place], [place] [address]      Provider Number: [id number]  Attending Physician Name and Address:  Kinnie Feil, MD  Relative Name and Phone Number:  Charlett Nose, daughter in law, [phone] [facility])    Current Level of Care: Hospital Recommended Level of Care: [place] Prior Approval Number:    Date Approved/Denied:   PASRR Number:    Discharge Plan: SNF    Current Diagnoses: Patient Active Problem List   Diagnosis Date Noted  . Elevated troponin   . Confusion   . AMS (altered mental status) 09/12/2019  . Acute kidney injury (nontraumatic) ([place])   . Atrial fibrillation ([place])   . Urinary tract infection without hematuria   . Hallucinations   . Stroke ([place]) 09/02/2019  . Microcytic anemia 08/09/2019  . Encounter to establish care with new doctor 07/27/2019  . Essential hypertension 07/27/2019  . GERD (gastroesophageal reflux disease) 07/27/2019  . Hyperlipidemia 07/27/2019  . Urinary incontinence in female 07/27/2019  . Poor appetite 07/27/2019    Orientation RESPIRATION BLADDER Height & Weight     (Disoriented x4)  Normal Continent Weight:   Height:  5\' 4"  (162.6 cm)  BEHAVIORAL SYMPTOMS/MOOD NEUROLOGICAL BOWEL NUTRITION STATUS      Continent Diet(Please see DC Summary)  AMBULATORY STATUS COMMUNICATION OF NEEDS Skin   Extensive Assist Verbally Normal                       Personal Care Assistance Level of Assistance  Bathing, Feeding, Dressing Bathing Assistance: Maximum assistance Feeding assistance: Maximum assistance Dressing Assistance: Maximum assistance     Functional Limitations Info  Sight, Hearing, Speech Sight Info:  Adequate Hearing Info: Adequate Speech Info: Adequate    SPECIAL CARE FACTORS FREQUENCY  PT (By licensed PT), OT (By licensed OT)     PT Frequency: 5x OT Frequency: 5x            Contractures Contractures Info: Not present    Additional Factors Info  Code Status, Allergies Code Status Info: Full Allergies Info: Penicillins           Current Medications (09/13/2019):  This is the current hospital active medication list Current Facility-Administered Medications  Medication Dose Route Frequency Provider Last Rate Last Dose  . 0.9 %  sodium chloride infusion   Intravenous Continuous Lurline Del, DO 115 mL/hr at 09/13/19 1822    . albuterol (PROVENTIL) (2.5 MG/3ML) 0.083% nebulizer solution 3 mL  3 mL Inhalation Q6H PRN Lurline Del, DO      . cefTRIAXone (ROCEPHIN) 1 g in sodium chloride 0.9 % 100 mL IVPB  1 g Intravenous Q24H Rory Percy, DO 200 mL/hr at 09/13/19 0527 1 g at 09/13/19 0527  . heparin injection 5,000 Units  5,000 Units Subcutaneous Q8H WelbornThurmond Butts, DO   5,000 Units at 09/13/19 1700     Discharge Medications: Please see discharge summary for a list of discharge medications.  Relevant Imaging Results:  Relevant Lab Results:   Additional Information COVID negative on 10/14  [place], LCSW

## 2019-09-13 NOTE — Evaluation (Signed)
Occupational Therapy Evaluation Patient Details Name: Mary Chavez MRN: [id number] DOB: 10-17-1928 Today's Date: 09/13/2019    History of Present Illness Pt is an 85 y/o female admitted secondary to AMS and hallucinations. Workup pending. MRI negative for acute abnormality, however, did showe chronic encephalomacia. PMH includes HTN, CVA, and a fib.    Clinical Impression   Upon arrival, pt supine in bed and talking to "someone" above her in the air; pt stating, "give me the phone." Pt requiring Total A +2 for bed mobility and Max-Total A for safe sitting balance at EOB. Pt hallucinating that her husband is present and wont give her the phone. Handed pt the room phone and she began talking with "someone" on the phone stating, "bring my purse and bank book. I am at the hospital, and I need to pay them." Due to cognitive deficits, pt requiring Max-Total A for ADLs. Pt would benefit from further acute OT to facilitate safe dc. Recommend dc to SNF for further OT to optimize safety, independence with ADLs, and return to PLOF.      Follow Up Recommendations  SNF;Supervision/Assistance - 24 hour    Equipment Recommendations  Other (comment)(Defer to next venue)    Recommendations for Other Services PT consult     Precautions / Restrictions Precautions Precautions: Fall;Other (comment) Precaution Comments: Hallucinations Restrictions Weight Bearing Restrictions: No      Mobility Bed Mobility Overal bed mobility: Needs Assistance Bed Mobility: Supine to Sit;Sit to Supine     Supine to sit: Total assist;+2 for physical assistance Sit to supine: Total assist;+2 for physical assistance   General bed mobility comments: Total A to bring BLEs to EOB and then elevate trunk. At EOB, pt pushing back into extension and requiring Max-Total A for support and to block knees. Pt requiring Total A to return to supine.   Transfers                 General transfer comment: Defered for  safety    Balance Overall balance assessment: Needs assistance Sitting-balance support: No upper extremity supported;Feet supported Sitting balance-Leahy Scale: Poor Sitting balance - Comments: Pt pushing back into extension and required Max-Total A for sitting balance                                   ADL either performed or assessed with clinical judgement   ADL Overall ADL's : Needs assistance/impaired                                       General ADL Comments: Due to cognitive deficits and poor safety, pt requiring Max-Total A for ADLs. Pt washing her face at bed level for 10 seconds after repeated cues.     Vision   Additional Comments: When reaching out for objects, pt both undershooting and overshooting. Unclear if vision deficits impacted this or hallucinations     Perception     Praxis      Pertinent Vitals/Pain Pain Assessment: Faces Faces Pain Scale: No hurt Pain Intervention(s): Monitored during session     Hand Dominance     Extremity/Trunk Assessment Upper Extremity Assessment Upper Extremity Assessment: Generalized weakness   Lower Extremity Assessment Lower Extremity Assessment: Defer to PT evaluation   Cervical / Trunk Assessment Cervical / Trunk Assessment: Other exceptions Cervical / Trunk Exceptions:  Increased body habitus   Communication Communication Communication: No difficulties   Cognition Arousal/Alertness: Awake/alert Behavior During Therapy: Restless;Impulsive;Anxious;Agitated Overall Cognitive Status: No family/caregiver present to determine baseline cognitive functioning Area of Impairment: Orientation;Attention;Memory;Following commands;Safety/judgement;Awareness;Problem solving                 Orientation Level: Disoriented to;Place;Situation;Time Current Attention Level: Focused Memory: Decreased short-term memory Following Commands: Follows one step commands  inconsistently Safety/Judgement: Decreased awareness of safety;Decreased awareness of deficits Awareness: Intellectual Problem Solving: Slow processing;Decreased initiation;Difficulty sequencing;Requires verbal cues;Requires tactile cues General Comments: Pt hallucinating that her husband was in the room and would not give her the phone. At EOB, pt with poor safety awareness and pushing back into extension. Pt perseverating on topic of talking on phone; giving pt phone and she "had a conservation" with therapist via the phone. Pt telling person on phone that she needed her purse so she could pay the hospital and go home. At one point, pt grabbing OT's arm and using this at the phone saying "hello, can you hear me?"    General Comments       Exercises     Shoulder Instructions      Home Living Family/patient expects to be discharged to:: Unsure                                 Additional Comments: Pt unable to provide home information given cognitive deficits.       Prior Functioning/Environment          Comments: Unsure of PLOF given cognitive deficits.         OT Problem List: Decreased activity tolerance;Impaired balance (sitting and/or standing);Decreased cognition;Impaired vision/perception      OT Treatment/Interventions: Self-care/ADL training;Therapeutic exercise;Energy conservation;DME and/or AE instruction;Therapeutic activities;Patient/family education    OT Goals(Current goals can be found in the care plan section) Acute Rehab OT Goals Patient Stated Goal: Unstated OT Goal Formulation: Patient unable to participate in goal setting Time For Goal Achievement: 09/27/19 Potential to Achieve Goals: Good  OT Frequency: Min 2X/week   Barriers to D/C:            Co-evaluation              AM-PAC OT "6 Clicks" Daily Activity     Outcome Measure Help from another person eating meals?: Total Help from another person taking care of personal  grooming?: Total Help from another person toileting, which includes using toliet, bedpan, or urinal?: Total Help from another person bathing (including washing, rinsing, drying)?: Total Help from another person to put on and taking off regular upper body clothing?: Total Help from another person to put on and taking off regular lower body clothing?: Total 6 Click Score: 6   End of Session Nurse Communication: Mobility status  Activity Tolerance: Treatment limited secondary to agitation Patient left: in bed;with call bell/phone within reach;with bed alarm set  OT Visit Diagnosis: Unsteadiness on feet (R26.81);Other abnormalities of gait and mobility (R26.89);Muscle weakness (generalized) (M62.81);Other symptoms and signs involving cognitive function                Time: 9211-9417 OT Time Calculation (min): 16 min Charges:  OT General Charges $OT Visit: 1 Visit OT Evaluation $OT Eval Moderate Complexity: 1 Mod  Clee Pandit MSOT, OTR/L Acute Rehab Pager: [phone] Office: [phone]  Theodoro Grist Louanna Vanliew 09/13/2019, 11:58 AM

## 2019-09-13 NOTE — Progress Notes (Signed)
Patient more confused and irritable this afternoon. Hallucinating and speaking out to no one. VS have been difficult to take, as patient is difficult to keep still. Continuing to monitor and MD [facility] aware.

## 2019-09-14 ENCOUNTER — Inpatient Hospital Stay (HOSPITAL_COMMUNITY): Payer: Medicare HMO

## 2019-09-14 DIAGNOSIS — R509 Fever, unspecified: Secondary | ICD-10-CM

## 2019-09-14 DIAGNOSIS — J189 Pneumonia, unspecified organism: Secondary | ICD-10-CM

## 2019-09-14 LAB — CBC WITH DIFFERENTIAL/PLATELET
Abs Immature Granulocytes: 0.09 10*3/uL — ABNORMAL HIGH (ref 0.00–0.07)
Basophils Absolute: 0.1 10*3/uL (ref 0.0–0.1)
Basophils Relative: 1 %
Eosinophils Absolute: 0 10*3/uL (ref 0.0–0.5)
Eosinophils Relative: 0 %
HCT: 35.7 % — ABNORMAL LOW (ref 36.0–46.0)
Hemoglobin: 11.6 g/dL — ABNORMAL LOW (ref 12.0–15.0)
Immature Granulocytes: 1 %
Lymphocytes Relative: 11 %
Lymphs Abs: 1.8 10*3/uL (ref 0.7–4.0)
MCH: 26.7 pg (ref 26.0–34.0)
MCHC: 32.5 g/dL (ref 30.0–36.0)
MCV: 82.1 fL (ref 80.0–100.0)
Monocytes Absolute: 1.1 10*3/uL — ABNORMAL HIGH (ref 0.1–1.0)
Monocytes Relative: 7 %
Neutro Abs: 12.7 10*3/uL — ABNORMAL HIGH (ref 1.7–7.7)
Neutrophils Relative %: 80 %
Platelets: 204 10*3/uL (ref 150–400)
RBC: 4.35 MIL/uL (ref 3.87–5.11)
RDW: 17.4 % — ABNORMAL HIGH (ref 11.5–15.5)
WBC: 15.8 10*3/uL — ABNORMAL HIGH (ref 4.0–10.5)
nRBC: 0 % (ref 0.0–0.2)

## 2019-09-14 LAB — RESPIRATORY PANEL BY PCR

## 2019-09-14 LAB — SARS CORONAVIRUS 2 BY RT PCR (HOSPITAL ORDER, PERFORMED IN ~~LOC~~ HOSPITAL LAB): SARS Coronavirus 2: NEGATIVE

## 2019-09-14 LAB — BASIC METABOLIC PANEL
Anion gap: 16 — ABNORMAL HIGH (ref 5–15)
BUN: 10 mg/dL (ref 8–23)
CO2: 21 mmol/L — ABNORMAL LOW (ref 22–32)
Calcium: 8.6 mg/dL — ABNORMAL LOW (ref 8.9–10.3)
Chloride: 105 mmol/L (ref 98–111)
Creatinine, Ser: 0.99 mg/dL (ref 0.44–1.00)
GFR calc Af Amer: 59 mL/min — ABNORMAL LOW (ref >60)
GFR calc non Af Amer: 51 mL/min — ABNORMAL LOW (ref >60)
Glucose, Bld: 91 mg/dL (ref 70–99)
Potassium: 3.3 mmol/L — ABNORMAL LOW (ref 3.5–5.1)
Sodium: 142 mmol/L (ref 135–145)

## 2019-09-14 MED ORDER — POTASSIUM CHLORIDE 10 MEQ/100ML IV SOLN
10.0000 meq | INTRAVENOUS | Status: AC
  Administered 2019-09-14 (×2): 10 meq via INTRAVENOUS
  Filled 2019-09-14 (×2): qty 100

## 2019-09-14 MED ORDER — VANCOMYCIN HCL IN DEXTROSE 750-5 MG/150ML-% IV SOLN
750.0000 mg | INTRAVENOUS | Status: DC
  Administered 2019-09-15 – 2019-09-16 (×2): 750 mg via INTRAVENOUS
  Filled 2019-09-14 (×2): qty 150

## 2019-09-14 MED ORDER — ACETAMINOPHEN 650 MG RE SUPP
650.0000 mg | Freq: Four times a day (QID) | RECTAL | Status: DC | PRN
  Administered 2019-09-14 (×2): 650 mg via RECTAL
  Filled 2019-09-14 (×2): qty 1

## 2019-09-14 MED ORDER — SODIUM CHLORIDE 0.9 % IV SOLN
2.0000 g | INTRAVENOUS | Status: DC
  Administered 2019-09-15 – 2019-09-16 (×2): 2 g via INTRAVENOUS
  Filled 2019-09-14 (×2): qty 2

## 2019-09-14 MED ORDER — VANCOMYCIN HCL 10 G IV SOLR
1250.0000 mg | Freq: Once | INTRAVENOUS | Status: AC
  Administered 2019-09-14: 1250 mg via INTRAVENOUS
  Filled 2019-09-14: qty 1250

## 2019-09-14 MED ORDER — CARVEDILOL 3.125 MG PO TABS: 3.1250 mg | ORAL_TABLET | Freq: Two times a day (BID) | ORAL | Status: DC

## 2019-09-14 MED ORDER — METRONIDAZOLE IN NACL 5-0.79 MG/ML-% IV SOLN
500.0000 mg | Freq: Three times a day (TID) | INTRAVENOUS | Status: DC
  Administered 2019-09-14 – 2019-09-15 (×3): 500 mg via INTRAVENOUS
  Filled 2019-09-14 (×3): qty 100

## 2019-09-14 MED ORDER — CARVEDILOL 3.125 MG PO TABS
3.1250 mg | ORAL_TABLET | Freq: Two times a day (BID) | ORAL | Status: DC
  Administered 2019-09-14 – 2019-09-23 (×18): 3.125 mg via ORAL
  Filled 2019-09-14 (×18): qty 1

## 2019-09-14 MED ORDER — SODIUM CHLORIDE 0.9 % IV SOLN: 1.0000 g | Freq: Every day | INTRAVENOUS | Status: DC

## 2019-09-14 MED ORDER — RISPERIDONE 0.5 MG PO TABS
0.2500 mg | ORAL_TABLET | Freq: Two times a day (BID) | ORAL | Status: DC
  Administered 2019-09-14: 11:00:00 0.25 mg via ORAL
  Filled 2019-09-14: qty 1

## 2019-09-14 NOTE — Progress Notes (Signed)
Pharmacy Antibiotic Note  Mary Chavez is a 85 y.o. female  with fevers.  Pharmacy has been consulted for Vancomycin dosing.  Plan: Vancomycin 1250 mg IV now, then 750 mg IV q24h for est AUC 467  Height: 5\' 4"  (162.6 cm) IBW/kg (Calculated) : 54.7  Temp (24hrs), Avg:99.9 F (37.7 C), Min:98.2 F (36.8 C), Max:102.4 F (39.1 C)  Recent Labs  Lab 09/12/19 0200 09/12/19 0920 09/12/19 0926 09/13/19 1346  WBC 8.0  --  8.5  --   CREATININE 1.39*  --  1.18* 1.08*  LATICACIDVEN 1.4 1.5  --   --     Estimated Creatinine Clearance: 35.1 mL/min (A) (by C-G formula based on SCr of 1.08 mg/dL (H)).    Allergies  Allergen Reactions  . Penicillins Swelling and Other (See Comments)    Did it involve swelling of the face/tongue/throat, SOB, or low BP? No- body became swollen, no breathing issues, though Did it involve sudden or severe rash/hives, skin peeling, or any reaction on the inside of your mouth or nose? No Did you need to seek medical attention at a hospital or doctor's office? Unk When did it last happen? "A long time ago" If all above answers are "NO", may proceed with cephalosporin use.      Caryl Pina 09/14/2019 4:44 AM

## 2019-09-14 NOTE — Progress Notes (Addendum)
Family Medicine Teaching Service Daily Progress Note Intern Pager: [phone]  Patient name: Mary Chavez Medical record number: [id number] Date of birth: March 04, 1928 Age: 85 y.o. Gender: female  Primary Care Provider: Patriciaann Clan, DO Consultants: Psychiatry Code Status: Full code  Pt Overview and Major Events to Date:  10/12: Patient admitted with UTI, questionable pneumonia, altered mental status, hallucinations, ceftriaxone and azithromycin started 10/13: Patient treated with ceftriaxone for UTI, continued confusion 10/14: Fever, new retrocardiac chest x-ray finding, broadened to vancomycin, ceftriaxone continued for UTI  Assessment and Plan: Mary Chavez a 85 y.o.femalepresenting with altered mental status and hallucinations. PMH is significant forhypertension, GERD, hyperlipidemia, urinary incontinence, history of stroke, and poor appetite.  Altered mental status, hallucinations Patient remains confused and disoriented.  Clinical social work discussed recommendations for SNF upon discharge, patient's daughter-in-law states that they are having difficulty caring for the patient at home due to her requirements.  Patient's family to discuss SNF placement at this time.  Overnight patient noted to become febrile, with a chest x-ray finding of left lower lobe findings, temp max of 102.4, broadened to vancomycin, continue ceftriaxone. Patient evaluated by psychiatry; recommendations include risperdal 0.25mg . -Psychiatry evaluated patient, starting Risperdal 0.25, will hold as we complete infectious workup in order to prevent masking of potential changes in mental status  -antibiotics for infection as detailed below   UTI Patient continues to have suprapubic pain upon abdominal palpation. Patient has elevated WBC to 15. Will monitor this.  -planned to continue CTX for 3 days (day 3 today), will discontinue  -repeat urine culture pending once we can collect sample   New CXR  findings concerning for PNA  CXR streaking noted and patient febrile to 102. Fever has now resolved and patient was broadened to Vanc in addition to CTX for UTI. Patient with new leukocytosis of 15.8 . Patient continues to be altered and only reports she is "cold" whereas she was oriented to location and able to answer questions to  On exam, patient has  -continue to monitor fever curve -monitor respiratory status  -continue Vanc  -add Unasyn for anaerobic coverage as patient may have aspirated given decline in mental status  -monitor new blood cultures  -will repeat COVID(negative) testing and RVP panel (droplet precautions until results) -monitor WBC   Possible history of atrial fibrillation Patient in AF during admission, unsure if this is new onset or historic. Will re-evaluate with repeat EKG and start beta blocker to control HR that has been tachycardic.  -continue cardiac monitoring -will start coreg 3.125 for rate control  -repeat EKG   AKI, resolved  AKI creatinine 1.39 on admission, now within normal limits at 0.99 -patient now on Vanc, will regularly monitor Cr for duration of this agent  -plan to restart lisinopril-HCTZ home med 20-25mg   Hypertension Patient hypertensive overnight ranging from 94-186/75-141. Most recently 146/98.  Patient home medications appear to include ZESTORETIC.  -Continue to monitor blood pressure with vitals -We will plan to restart antihypertensive medication today.  Patient with QTC prolongation of 516 -Holding QTC prolonging medications -Patient on cardiac monitoring  History of a fall Patient is reported to have had a fall while living in [place] and has residual right lower extremity weakness -Patient on fall precautions -PT/OT recommending SNF upon discharge  GERD Home medications include Protonix 20 and Pepcid 20 -Patient to continue  FEN/GI: Dysphagia diet 2 PPx: Heparin 5000 units  Disposition: Awaiting family's final  decision for SNF placement as recommended by PT/OT  Subjective:  Patient   Objective: Temp:  [98.1 F (36.7 C)-102.4 F (39.1 C)] 98.1 F (36.7 C) (10/14 1235) Pulse Rate:  [87-120] 87 (10/14 1235) Resp:  [14-24] 14 (10/14 1235) BP: (94-186)/(67-137) 109/67 (10/14 1235) SpO2:  [96 %-100 %] 99 % (10/14 0821)  Physical Exam: General: elderly female lying in bed, shivering stating "I'm cold", rigors  Cardiovascular: RRR without  Respiratory: diminished breath, coarse crackles appreciated,, unable to auscultate posteriorly fully as patient was shaking  Abdomen: soft, minimal suprapubic tenderness Extremities: no LE edema   Laboratory: Recent Labs  Lab 09/12/19 0200 09/12/19 0926 09/14/19 0424  WBC 8.0 8.5 15.8*  HGB 12.9 12.2 11.6*  HCT 40.5 39.2 35.7*  PLT 193 219 204   Recent Labs  Lab 09/12/19 0200 09/12/19 0926 09/13/19 1346 09/14/19 0424  NA 142 143 141 142  K 3.8 3.3* 3.6 3.3*  CL 106 106 104 105  CO2 24 24 20* 21*  BUN 23 22 11 10   CREATININE 1.39* 1.18* 1.08* 0.99  CALCIUM 9.9 9.8 8.9 8.6*  PROT 7.9 7.3  --   --   BILITOT 0.9 0.9  --   --   ALKPHOS 57 52  --   --   ALT 12 12  --   --   AST 25 27  --   --   GLUCOSE 113* 113* 107* 91    Imaging/Diagnostic Tests: CXR: resolution of middle right lobe opacity with new streaky retrocardiac opacity   , MD 09/14/2019, 4:18 PM PGY-1, [facility] Family Medicine FPTS Intern pager: [phone], text pages welcome

## 2019-09-14 NOTE — Progress Notes (Signed)
Patient's family was called, spoke with her son with her other son in the background.  He reports her hallucinations started on Sunday and feels it is related to her UTI as they have not seen any of the symptoms prior.  Discussed medications and agreeable to start Risperdal.  Concern for his mother's health and desires for her to return home after discharge to low with him and his wife.  He does report he is hoping to get a home health nurse to come out twice a week to monitor her health.  Waylan Boga, [facility] NP

## 2019-09-14 NOTE — Progress Notes (Signed)
Patient temperature back up to 100.4.Dr. Vanessa [place] aware.New orders received.Patient refusing lab draw at this time.MD made aware.

## 2019-09-14 NOTE — Progress Notes (Signed)
MEWS Guidelines - (patients age 83 and over)  Red - At High Risk for Deterioration Yellow - At risk for Deterioration  1. Go to room and assess patient 2. Validate data. Is this patient's baseline? If data confirmed: 3. Is this an acute change? 4. Administer prn meds/treatments as ordered. 5. Note Sepsis score 6. Review goals of care 7. Sports coach, RRT nurse and Provider. 8. Ask Provider to come to bedside.  9. Document patient condition/interventions/response. 10. Increase frequency of vital signs and focused assessments to at least q15 minutes x 4, then q30 minutes x2. - If stable, then q1h x3, then q4h x3 and then q8h or dept. routine. - If unstable, contact Provider & RRT nurse. Prepare for possible transfer. 11. Add entry in progress notes using the smart phrase ".MEWS". 1. Go to room and assess patient 2. Validate data. Is this patient's baseline? If data confirmed: 3. Is this an acute change? 4. Administer prn meds/treatments as ordered? 5. Note Sepsis score 6. Review goals of care 7. Sports coach and Provider 8. Call RRT nurse as needed. 9. Document patient condition/interventions/response. 10. Increase frequency of vital signs and focused assessments to at least q2h x2. - If stable, then q4h x2 and then q8h or dept. routine. - If unstable, contact Provider & RRT nurse. Prepare for possible transfer. 11. Add entry in progress notes using the smart phrase ".MEWS".  Green - Likely stable Lavender - Comfort Care Only  1. Continue routine/ordered monitoring.  2. Review goals of care. 1. Continue routine/ordered monitoring. 2. Review goals of care.   Patient has temperature 101.3 and blood pressure 186/137.Patient remains confused alert to self only. Patient lungs sound diminished  but clear does however have congested cough.Dr.Welborn notified.To come bedside to see patient.

## 2019-09-14 NOTE — Progress Notes (Signed)
Went to evaluate patient after being informed by patient nurse that patient had repeat fever of 101.3 and blood pressure elevated 186/137.  Met nurse at bedside. Repeat blood pressure check was 156/77.  Patient did have a cough in the room but was breathing well on room air.   General: Patient lying in bed, responds to questions with one-word answers.  Does not seem to be endorsing hallucinations at this time. Heart: Irregular rhythm, no murmurs appreciated Lungs: Initial course breath sound that cleared after cough, no wheezing, no rhonchi.  Plan: -Portable chest x-ray -Tylenol suppository prn for fever -We will continue to follow closely

## 2019-09-14 NOTE — Progress Notes (Signed)
Brief update. I will consign resident's note once it is completed.    She is a bit more agitated this morning. Per the report, she has been running a fever, and her heart is in the one-teens.  Plan: AMS: More agitated today. Likely due to delirium from a change in the environment plus an infectious process. Repeat chest X-ray suggests PNA. Likely community-acquired, since she is less than 48 hours of admission. Also, consider aspiration PNA. We will broaden her A/B coverage. Tylenol as needed for fever. Repeat COVID-19 test is negative. Blood culture and urine culture has been re-ordered. Viral panel pending.  Tachycardia. Likely due to fever. EKG shows Afib with RVR. We will initiate Coreg for rate control. We are holding off on initiating anticoagulant due to her instability status and risk for fall.  Monitor closely.  HTN: Start Coreg.  Other chronic problems see resident's documentation.

## 2019-09-15 DIAGNOSIS — R443 Hallucinations, unspecified: Secondary | ICD-10-CM

## 2019-09-15 DIAGNOSIS — F05 Delirium due to known physiological condition: Secondary | ICD-10-CM

## 2019-09-15 LAB — CBC WITH DIFFERENTIAL/PLATELET
Abs Immature Granulocytes: 0.05 10*3/uL (ref 0.00–0.07)
Basophils Absolute: 0.1 10*3/uL (ref 0.0–0.1)
Basophils Relative: 0 %
Eosinophils Absolute: 0 10*3/uL (ref 0.0–0.5)
Eosinophils Relative: 0 %
HCT: 31.7 % — ABNORMAL LOW (ref 36.0–46.0)
Hemoglobin: 10.5 g/dL — ABNORMAL LOW (ref 12.0–15.0)
Immature Granulocytes: 0 %
Lymphocytes Relative: 11 %
Lymphs Abs: 2 10*3/uL (ref 0.7–4.0)
MCH: 26.8 pg (ref 26.0–34.0)
MCHC: 33.1 g/dL (ref 30.0–36.0)
MCV: 80.9 fL (ref 80.0–100.0)
Monocytes Absolute: 0.8 10*3/uL (ref 0.1–1.0)
Monocytes Relative: 4 %
Neutro Abs: 15.9 10*3/uL — ABNORMAL HIGH (ref 1.7–7.7)
Neutrophils Relative %: 85 %
Platelets: 201 10*3/uL (ref 150–400)
RBC: 3.92 MIL/uL (ref 3.87–5.11)
RDW: 17.2 % — ABNORMAL HIGH (ref 11.5–15.5)
WBC: 18.8 10*3/uL — ABNORMAL HIGH (ref 4.0–10.5)
nRBC: 0 % (ref 0.0–0.2)

## 2019-09-15 LAB — URINE CULTURE: Culture: NO GROWTH

## 2019-09-15 LAB — BASIC METABOLIC PANEL
Anion gap: 16 — ABNORMAL HIGH (ref 5–15)
BUN: 18 mg/dL (ref 8–23)
CO2: 17 mmol/L — ABNORMAL LOW (ref 22–32)
Calcium: 8.3 mg/dL — ABNORMAL LOW (ref 8.9–10.3)
Chloride: 108 mmol/L (ref 98–111)
Creatinine, Ser: 1.21 mg/dL — ABNORMAL HIGH (ref 0.44–1.00)
GFR calc Af Amer: 46 mL/min — ABNORMAL LOW (ref >60)
GFR calc non Af Amer: 40 mL/min — ABNORMAL LOW (ref >60)
Glucose, Bld: 98 mg/dL (ref 70–99)
Potassium: 3.1 mmol/L — ABNORMAL LOW (ref 3.5–5.1)
Sodium: 141 mmol/L (ref 135–145)

## 2019-09-15 LAB — MRSA PCR SCREENING: MRSA by PCR: NEGATIVE

## 2019-09-15 LAB — MAGNESIUM: Magnesium: 1.5 mg/dL — ABNORMAL LOW (ref 1.7–2.4)

## 2019-09-15 MED ORDER — MAGNESIUM SULFATE 2 GM/50ML IV SOLN
2.0000 g | Freq: Once | INTRAVENOUS | Status: AC
  Administered 2019-09-15: 2 g via INTRAVENOUS
  Filled 2019-09-15: qty 50

## 2019-09-15 MED ORDER — POTASSIUM CHLORIDE 10 MEQ/100ML IV SOLN
10.0000 meq | INTRAVENOUS | Status: AC
  Administered 2019-09-15 (×2): 10 meq via INTRAVENOUS
  Filled 2019-09-15 (×2): qty 100

## 2019-09-15 MED ORDER — POTASSIUM CHLORIDE 10 MEQ/100ML IV SOLN
10.0000 meq | INTRAVENOUS | Status: DC
  Administered 2019-09-15 (×2): 10 meq via INTRAVENOUS
  Filled 2019-09-15 (×2): qty 100

## 2019-09-15 NOTE — Progress Notes (Signed)
NCM noted SNF bed offers and shared with Mary Chavez (son). Son selected [place] for SNF placement when mom medically ready. NCM made liaison/ Tammy @ [facility] aware. NCM will alert liaison to begin  Insurance authorization closer to d/c readiness. Whitman Hero RN,BSN,CM [phone]

## 2019-09-15 NOTE — NC FL2 (Signed)
[place] LEVEL OF CARE SCREENING TOOL     IDENTIFICATION  Patient Name: Mary Chavez Birthdate: 01-11-1928 Sex: female Admission Date (Current Location): 09/12/2019  [facility] and [place] Number:  Herbalist and Address:  [facility] [place], [place] [address] [place], [place] [address]      Provider Number: [id number]  Attending Physician Name and Address:  McDiarmid, Blane Ohara, MD  Relative Name and Phone Number:  Charlett Nose, daughter in law, [phone] [facility])    Current Level of Care: Hospital Recommended Level of Care: [place] Prior Approval Number:    Date Approved/Denied:   PASRR Number: [id number]  Discharge Plan: SNF    Current Diagnoses: Patient Active Problem List   Diagnosis Date Noted  . Community acquired pneumonia of right middle lobe of lung   . Fever   . Elevated troponin   . Confusion   . AMS (altered mental status) 09/12/2019  . Acute kidney injury (nontraumatic) ([place])   . Atrial fibrillation ([place])   . Urinary tract infection without hematuria   . Hallucinations   . Stroke ([place]) 09/02/2019  . Microcytic anemia 08/09/2019  . Encounter to establish care with new doctor 07/27/2019  . Essential hypertension 07/27/2019  . GERD (gastroesophageal reflux disease) 07/27/2019  . Hyperlipidemia 07/27/2019  . Urinary incontinence in female 07/27/2019  . Poor appetite 07/27/2019    Orientation RESPIRATION BLADDER Height & Weight     (Disoriented x4)  Normal Continent Weight: 75 kg Height:  5\' 4"  (162.6 cm)  BEHAVIORAL SYMPTOMS/MOOD NEUROLOGICAL BOWEL NUTRITION STATUS      Continent Diet(Please see DC Summary)  AMBULATORY STATUS COMMUNICATION OF NEEDS Skin   Extensive Assist Verbally Normal                       Personal Care Assistance Level of Assistance  Bathing, Feeding, Dressing Bathing Assistance: Maximum assistance Feeding assistance: Maximum assistance Dressing Assistance: Maximum  assistance     Functional Limitations Info  Sight, Hearing, Speech Sight Info: Adequate Hearing Info: Adequate Speech Info: Adequate    SPECIAL CARE FACTORS FREQUENCY  PT (By licensed PT), OT (By licensed OT)     PT Frequency: 5x OT Frequency: 5x            Contractures Contractures Info: Not present    Additional Factors Info  Code Status, Allergies Code Status Info: Full Allergies Info: Penicillins           Current Medications (09/15/2019):  This is the current hospital active medication list Current Facility-Administered Medications  Medication Dose Route Frequency Provider Last Rate Last Dose  . 0.9 %  sodium chloride infusion   Intravenous Continuous Lurline Del, DO 115 mL/hr at 09/14/19 0139    . acetaminophen (TYLENOL) suppository 650 mg  650 mg Rectal Q6H PRN Lurline Del, DO   650 mg at 09/14/19 1755  . albuterol (PROVENTIL) (2.5 MG/3ML) 0.083% nebulizer solution 3 mL  3 mL Inhalation Q6H PRN Lurline Del, DO      . carvedilol (COREG) tablet 3.125 mg  3.125 mg Oral BID WC Rory Percy, DO   3.125 mg at 09/15/19 0906  . cefTRIAXone (ROCEPHIN) 2 g in sodium chloride 0.9 % 100 mL IVPB  2 g Intravenous Q24H Rory Percy, DO 200 mL/hr at 09/15/19 0541 2 g at 09/15/19 0541  . heparin injection 5,000 Units  5,000 Units Subcutaneous Q8H Welborn, Ryan, DO   5,000 Units at 09/15/19 0543  .  vancomycin (VANCOCIN) IVPB 750 mg/150 ml premix  750 mg Intravenous Q24H Janit Pagan T, MD 150 mL/hr at 09/15/19 0911 750 mg at 09/15/19 0911     Discharge Medications: Please see discharge summary for a list of discharge medications.  Relevant Imaging Results:  Relevant Lab Results:   Additional Information SS # [id number]  Epifanio Lesches, RN

## 2019-09-15 NOTE — Progress Notes (Signed)
  Speech Language Pathology Treatment: Dysphagia  Patient Details Name: Mary Chavez MRN: [id number] DOB: September 13, 1928 Today's Date: 09/15/2019 Time: 3825-0539 SLP Time Calculation (min) (ACUTE ONLY): 17 min  Assessment / Plan / Recommendation Clinical Impression  SLP observed pt after RN reported increased secretions concerning for aspiration pneumonia. Pt previously on Dys 1/ thin via cup but switched to NPO after increased instances of coughing and decreased management of secretions. Baseline throat clear noticed upon entry. Pt was observed with thin via cup and puree. Pt required total assist for sips and was resistant to accepting water, demonstrated decreased awareness of whether she was receiving the water or not. Thin trials with assist were followed by immediate throat clear and weak unproductive coughs. Puree resulted in immediate cough during swallow in 1/2 trials,. After coughing pt swallowed secretions before SLP could suction. Due to increased s/sx of aspiration, recommend continue NPO (meds crushed in puree) until completion of instrumental study to assess swallow safety. MBS to be scheduled for tomorrow morning.    HPI HPI: Pt is a 85 Y/O F with PMX of  HTN, HLD, CVA brought in by the family for change in mental status and Hallucination, which started a few hours before presentation. Denies LOC, no fall, no facial asymmetry, no limb weakness.  Head CT and Brain MRI were clear for acute abnormalities, but revealed "chronic encephalomalacia" and "generalized brain atrophy".  CXR on 10/12 reported: "Findings suspicious for right middle lobe consolidation, possibly pneumonia".  No documented dysphagia hx.       SLP Plan  MBS       Recommendations  Diet recommendations: NPO Medication Administration: Crushed with puree                Oral Care Recommendations: Oral care QID;Staff/trained caregiver to provide oral care Follow up Recommendations: 24 hour  supervision/assistance;Skilled Nursing facility SLP Visit Diagnosis: Dysphagia, unspecified (R13.10) Plan: MBS       GO                Audrick Lamoureaux 09/15/2019, 3:34 PM

## 2019-09-15 NOTE — Progress Notes (Signed)
Physical Therapy Treatment Patient Details Name: Mary Chavez MRN: [id number] DOB: 02-09-1928 Today's Date: 09/15/2019    History of Present Illness Pt is an 85 y/o female admitted secondary to AMS and hallucinations. Workup pending. MRI negative for acute abnormality, however, did showe chronic encephalomacia. PMH includes HTN, CVA, and a fib.     PT Comments    Pt on arrival found with head lying on L rail of bed and bed and gown saturated in saliva on L side.  Her mouth was constantly dripping saliva during rolling and when sitting edge of bed.  Yaunker suction used multiple times to oral cavity.  Pt is less alert and only oriented to herself.  [place]  Based on cognitive decline and decreased function informed RN and MD of change.  She continues to require SNF placement based on presentation.      Follow Up Recommendations  SNF;Supervision/Assistance - 24 hour     Equipment Recommendations  Other (comment)(TBD)    Recommendations for Other Services       Precautions / Restrictions Precautions Precautions: Fall Precaution Comments: Hallucinations Restrictions Weight Bearing Restrictions: No    Mobility  Bed Mobility Overal bed mobility: Needs Assistance Bed Mobility: Supine to Sit;Sit to Supine;Rolling Rolling: Total assist;+2 for physical assistance   Supine to sit: Total assist;+2 for physical assistance Sit to supine: Total assist;+2 for physical assistance   General bed mobility comments: Pt unable to follow commands on arrival found with bowel incontinence and required assistance to roll to R and L for clean up.  She presented with her head on the rail and copious amounts of drool on L side from her mouth. Performed supine to sit to see if patient was more rousable.  she presented with head forward and slumped posture.  She required total assistance to return back to bed.  Transfers                 General transfer comment: Did  not attempt to stand as patient too lethargic and not initiating any movement.  Will reassess next session.  Ambulation/Gait                 Stairs             Wheelchair Mobility    Modified Rankin (Stroke Patients Only)       Balance Overall balance assessment: Needs assistance Sitting-balance support: No upper extremity supported;Feet supported Sitting balance-Leahy Scale: Poor Sitting balance - Comments: Pt with LOB in different directions.  She required assistance to maintain sitting.                                    Cognition Arousal/Alertness: Lethargic Behavior During Therapy: Flat affect Overall Cognitive Status: Impaired/Different from baseline                   Orientation Level: Disoriented to;Place;Time;Situation(reports she is in the woods.)             General Comments: Pt not following commands required total assistance for all aspects of mobility.      Exercises      General Comments        Pertinent Vitals/Pain Pain Assessment: Faces Faces Pain Scale: No hurt    Home Living  Prior Function            PT Goals (current goals can now be found in the care plan section) Acute Rehab PT Goals Patient Stated Goal: Unstated PT Goal Formulation: Patient unable to participate in goal setting Potential to Achieve Goals: Fair Progress towards PT goals: Progressing toward goals    Frequency    Min 2X/week      PT Plan Current plan remains appropriate    Co-evaluation              AM-PAC PT "6 Clicks" Mobility   Outcome Measure  Help needed turning from your back to your side while in a flat bed without using bedrails?: Total Help needed moving from lying on your back to sitting on the side of a flat bed without using bedrails?: Total Help needed moving to and from a bed to a chair (including a wheelchair)?: Total Help needed standing up from a chair using your  arms (e.g., wheelchair or bedside chair)?: Total Help needed to walk in hospital room?: Total Help needed climbing 3-5 steps with a railing? : Total 6 Click Score: 6    End of Session Equipment Utilized During Treatment: Gait belt Activity Tolerance: Treatment limited secondary to agitation Patient left: in bed;with call bell/phone within reach;with bed alarm set(Pt positioned into chair position for hopeful improvement in management of secretions.) Nurse Communication: Mobility status;Other (comment)(secretions and change in status.) PT Visit Diagnosis: Other abnormalities of gait and mobility (R26.89);Unsteadiness on feet (R26.81);Muscle weakness (generalized) (M62.81)     Time: 4970-2637 PT Time Calculation (min) (ACUTE ONLY): 31 min  Charges:  $Therapeutic Activity: 23-37 mins                     Governor Rooks, PTA Acute Rehabilitation Services Pager [phone] Office [phone]     Dayelin Balducci Eli Hose 09/15/2019, 1:55 PM

## 2019-09-15 NOTE — Progress Notes (Addendum)
Family Medicine Teaching Service Daily Progress Note Intern Pager: [phone]  Patient name: Mary Chavez Medical record number: [id number] Date of birth: [birth year] Age: 85 y.o. Gender: female  Primary Care Provider: Patriciaann Clan, DO Consultants: Psychiatry Code Status: Full Code   Pt Overview and Major Events to Date:  10/12: Patient admitted with UTI, questionable pneumonia, altered mental status, hallucinations, ceftriaxone and azithromycin started 10/13: Patient treated with ceftriaxone for UTI, continued confusion 10/14: Fever, new retrocardiac chest x-ray finding, broadened to vancomycin, ceftriaxone continued for UTI 10/15: Vanc day 2, CTX day 4, Day 2 flagyll, improved mentation   Assessment and Plan: Mary Zaborowski Allenis a 85 y.o.femalepresenting with altered mental status and hallucinations. PMH is significant forhypertension, GERD, hyperlipidemia, urinary incontinence, history of stroke, and poor appetite.  Altered mental status, hallucinations Patient is oriented and more alert this morning and states that she feels better today than yesterday. She was able to answer orientation questions correctly telling me she is in the hospital, in [place] and oriented to self. Further conversations with patient's son, family states they would like for the patient to return home and potentially have home health nurse come to assist with the patient's care.  Patient was afebrile overnight.  New recommendations from psychiatric evaluation include risperdal 0.25mg ; we have decided to hold this medication in order to prevent masking of changes in patient's mental status as we complete infectious work-up.  Patient repeat coving testing was negative and respiratory viral panel was negative.  Suspicion is that patient possibly aspirated causing this new chest x-ray finding and fever, this leads me to believe that this is contributing to her altered mental state in appearance to worsen while patient  has been hospitalized. Overall, patient is improving and tolerating PO fluids while I was in the room.   -Psychiatry evaluated patient, ordered Risperdal 0.25, will hold as we complete infectious workup in order to prevent masking of potential changes in mental status  -antibiotics for infection as detailed below (Vanc, CTX, Flagyl)   UTI Patient continues to have suprapubic pain upon abdominal palpation. Patient has elevated WBC to 15 with increased to 18.8 today. Will monitor this.  Previous urine cultures grew less than 40,000 colonies of E. coli.  After patient's new onset fever, repeat urine cultures were drawn.  Pending results at this time.  Patient has remained afebrile overnight.  We believe that this is contributing to patient's altered mental status. Patient continues to have suprapubic tenderness on palpation.  -continuing CTX  -We will follow-up repeat urine cultures  PNA, likely aspiration pneumonia  Patient previously febrile on repeat chest x-ray there were findings in left lobe retrocardiac.  Patient has remained afebrile overnight.  This morning patient is more oriented and does not report difficulty with respirations.  It is likely that this new pneumonia is contributing to patient's decline in mental status.  O2 saturations between 94 to 99% on room air overnight.  Patient with no tachypnea and respiratory rate ranged between 14 and 20.  We will plan to continue antibiotic therapy. On exam, patient has improved respiratory status, continues to be stable on RA. WBC increased to 18 from 15 overnight.  -monitor respiratory status  -continue Vanc  -discontinue flagyll, will no longer cover for anaerobes  -Repeat blood cultures are negative   -Discontinued respiratory precautions -Continue to monitor fever curve -monitor WBC  -will collect MRSA PCR   Hypokalemia Patient with potassium of 3.1 -Replenish potassium with IV K  -Follow-up magnesium, will  replete as level was  low at 1.5   AKI Patient with recent increase in creatinine from 0.9-1.2 overnight.  Will continue to monitor this with creatinine levels in the a.m.  It is likely that new vancomycin is contributing to this elevated Cr.  Patient on maintenance IV fluids at 150 mL/h -Continue maintenance IV fluids -Continue to monitor creatinine with BMP  Atrial fibrillation Patient in AF during admission, unsure if this is new onset or historic. Will re-evaluate with repeat EKG and start beta blocker to control HR that has been tachycardic. Repeat EKG showed continued AF. Patient's tachycardia is improved, still slightly elevated at 104. CHADVASC score of 6. -continue coreg 3.125 for rate control of AF  -will discuss anticoagulation with family members and assess if patient has been on in the past   Hypertension Patient hypertensive overnight ranging from . Most recently 146/98.  Patient home medications appear to include ZESTORETIC.  -Continue to monitor blood pressure with vitals -Continue recovery 3.125 twice daily -We will continue to hold patient's home ACE inhibitor as she has recently had increasing creatinine  Patient with QTC prolongation  -Holding QTC prolonging medications -Patient on cardiac monitoring  History of a fall Patient is reported to have had a fall while living in [place] and has residual right lower extremity weakness -Patient on fall precautions -PT/OT recommending SNF upon discharge  GERD Home medications include Protonix 20 and Pepcid 20 -We will hold these medications as patient is not tolerating p.o. at this time nor symptomatic   FEN/GI:N.p.o., maintenance fluids KGM:WNUUVOZ 5000 units  Disposition:  Discharge pending medical stabilization, patient's family has selected SNF as recommended by PT/OT at discharge   Objective: Temp:  [98.1 F (36.7 C)-99.9 F (37.7 C)] 99.3 F (37.4 C) (10/15 0907) Pulse Rate:  [67-157] 80 (10/15 0907) Resp:  [14-22] 22  (10/15 0907) BP: (97-144)/(46-124) 107/46 (10/15 0907) SpO2:  [94 %-98 %] 97 % (10/15 0907) Weight:  [75 kg] 75 kg (10/15 0432)  Physical Exam: General: elderly female, chronically ill appearing, lying in bed  Cardiovascular: irregularly irregular, no gallops appreciated  Respiratory: aeration  Abdomen: suprapubic tenderness, no other sites of abdominal tenderness, bowel sounds present throughout, abdomen is soft  Extremities: trace edema in bilateral lower extremities   Laboratory: Recent Labs  Lab 09/12/19 0926 09/14/19 0424 09/15/19 0313  WBC 8.5 15.8* 18.8*  HGB 12.2 11.6* 10.5*  HCT 39.2 35.7* 31.7*  PLT 219 204 201   Recent Labs  Lab 09/12/19 0200 09/12/19 0926 09/13/19 1346 09/14/19 0424 09/15/19 0313  NA 142 143 141 142 141  K 3.8 3.3* 3.6 3.3* 3.1*  CL 106 106 104 105 108  CO2 24 24 20* 21* 17*  BUN 23 22 11 10 18   CREATININE 1.39* 1.18* 1.08* 0.99 1.21*  CALCIUM 9.9 9.8 8.9 8.6* 8.3*  PROT 7.9 7.3  --   --   --   BILITOT 0.9 0.9  --   --   --   ALKPHOS 57 52  --   --   --   ALT 12 12  --   --   --   AST 25 27  --   --   --   GLUCOSE 113* 113* 107* 91 98   Imaging/Diagnostic Tests: No new imaging   , MD 09/15/2019, 9:52 AM PGY-1, [facility] FPTS Intern pager: [phone], text pages welcome

## 2019-09-15 NOTE — Progress Notes (Addendum)
FPTS Interim Progress Note  S: Received secure chat massage from from physical therapist, Governor Rooks, expressing concern for patient being found with pooling of saliva and leaning onto side rail of bed and disoriented. Reported to bed side to find physical therapy and nursing at patient's bedside and patient sitting up in bed. Patient was oriented to location and state but unable to answer question about the year. Patient reported no difficulty breathing or swallowing and denied questions to localize pain. On neurological evaluation, patient was able to follow commands and exhibited no neurological deficits when asked to move her extremities or demonstrate that her EOM were intact bilaterally. Speech was coherent, patient was without facial droop and was not excessively salivating at the time of my exam. Patient remained on RA and was not I respiratory distress. Patient did cough once while I was at the bedside but I was unable to appreciate any further coughing episodes.  It appears that the patient's mental status waxes and wanes throughout the day. I have low suspicion for TIA/stroke, as the patient did not have neurological deficits. Given patient's new CXR findings and recent fever concerning for aspiration PNA, patient was made NPO and repeat evaluation by SLP was ordered. We will continue to appreciate SLP's recommendations for Mary Chavez and keep her NPO until further recommendations following the patient's modified barium swallow study tomorrow. Patient may have meds crushed in puree for now.   O: BP (!) 116/46 (BP Location: Left Arm)   Pulse 60   Temp (!) 97.5 F (36.4 C) (Axillary)   Resp 18   Ht 5\' 4"  (1.626 m)   Wt 75 kg   SpO2 99%   BMI 28.38 kg/m     Stark Klein, MD 09/15/2019, 9:20 PM PGY-1, [place] Medicine Service pager [phone]

## 2019-09-16 ENCOUNTER — Inpatient Hospital Stay (HOSPITAL_COMMUNITY): Payer: Medicare HMO

## 2019-09-16 DIAGNOSIS — J189 Pneumonia, unspecified organism: Secondary | ICD-10-CM | POA: Diagnosis present

## 2019-09-16 DIAGNOSIS — M25579 Pain in unspecified ankle and joints of unspecified foot: Secondary | ICD-10-CM

## 2019-09-16 DIAGNOSIS — M25571 Pain in right ankle and joints of right foot: Secondary | ICD-10-CM

## 2019-09-16 LAB — CBC WITH DIFFERENTIAL/PLATELET
Abs Immature Granulocytes: 0.05 10*3/uL (ref 0.00–0.07)
Basophils Absolute: 0.1 10*3/uL (ref 0.0–0.1)
Basophils Relative: 1 %
Eosinophils Absolute: 0.5 10*3/uL (ref 0.0–0.5)
Eosinophils Relative: 4 %
HCT: 30.5 % — ABNORMAL LOW (ref 36.0–46.0)
Hemoglobin: 9.6 g/dL — ABNORMAL LOW (ref 12.0–15.0)
Immature Granulocytes: 0 %
Lymphocytes Relative: 16 %
Lymphs Abs: 2.3 10*3/uL (ref 0.7–4.0)
MCH: 26.4 pg (ref 26.0–34.0)
MCHC: 31.5 g/dL (ref 30.0–36.0)
MCV: 83.8 fL (ref 80.0–100.0)
Monocytes Absolute: 1 10*3/uL (ref 0.1–1.0)
Monocytes Relative: 7 %
Neutro Abs: 10.4 10*3/uL — ABNORMAL HIGH (ref 1.7–7.7)
Neutrophils Relative %: 72 %
Platelets: 195 10*3/uL (ref 150–400)
RBC: 3.64 MIL/uL — ABNORMAL LOW (ref 3.87–5.11)
RDW: 17.7 % — ABNORMAL HIGH (ref 11.5–15.5)
WBC: 14.4 10*3/uL — ABNORMAL HIGH (ref 4.0–10.5)
nRBC: 0 % (ref 0.0–0.2)

## 2019-09-16 LAB — BASIC METABOLIC PANEL
Anion gap: 14 (ref 5–15)
BUN: 25 mg/dL — ABNORMAL HIGH (ref 8–23)
CO2: 16 mmol/L — ABNORMAL LOW (ref 22–32)
Calcium: 8.4 mg/dL — ABNORMAL LOW (ref 8.9–10.3)
Chloride: 114 mmol/L — ABNORMAL HIGH (ref 98–111)
Creatinine, Ser: 1.13 mg/dL — ABNORMAL HIGH (ref 0.44–1.00)
GFR calc Af Amer: 50 mL/min — ABNORMAL LOW (ref >60)
GFR calc non Af Amer: 43 mL/min — ABNORMAL LOW (ref >60)
Glucose, Bld: 78 mg/dL (ref 70–99)
Potassium: 4 mmol/L (ref 3.5–5.1)
Sodium: 144 mmol/L (ref 135–145)

## 2019-09-16 MED ORDER — APIXABAN 5 MG PO TABS
5.0000 mg | ORAL_TABLET | Freq: Two times a day (BID) | ORAL | Status: DC
  Administered 2019-09-17 – 2019-09-23 (×13): 5 mg via ORAL
  Filled 2019-09-16 (×13): qty 1

## 2019-09-16 MED ORDER — CEFDINIR 300 MG PO CAPS
300.0000 mg | ORAL_CAPSULE | Freq: Two times a day (BID) | ORAL | Status: AC
  Administered 2019-09-17 – 2019-09-18 (×4): 300 mg via ORAL
  Filled 2019-09-16 (×4): qty 1

## 2019-09-16 MED ORDER — AZITHROMYCIN 500 MG PO TABS
500.0000 mg | ORAL_TABLET | Freq: Every day | ORAL | Status: AC
  Administered 2019-09-16 – 2019-09-17 (×2): 500 mg via ORAL
  Filled 2019-09-16 (×2): qty 1

## 2019-09-16 NOTE — Care Management Important Message (Signed)
Important Message  Patient Details  Name: Mary Chavez MRN: [id number] Date of Birth: [birth year]   Medicare Important Message Given:  Yes     [name] 09/16/2019, 2:32 PM

## 2019-09-16 NOTE — Progress Notes (Addendum)
Family Medicine Teaching Service Daily Progress Note Intern Pager: [phone]  Patient name: Mary Chavez Medical record number: [id number] Date of birth: December 05, 1927 Age: 85 y.o. Gender: female  Primary Care Provider: Allayne Stack, DO Consultants: SLP,Psychiatry (s/o) Code Status: Full Code   Pt Overview and Major Events to Date: 10/12: Patient admitted with UTI, questionable pneumonia, altered mental status, hallucinations,ceftriaxone and azithromycin started 10/13: Patient treated with ceftriaxone for UTI, continued confusion 10/14: Fever, new retrocardiac chest x-ray finding, broadened to vancomycin,ceftriaxone continuedfor UTI 10/15: Vanc day 3, CTX day 4, Day 2 flagyll(d/c), improved mentation but then regressed mentation  10/16- transitioned to PO abx for PNA, discontinued vanc and CTX, NPO for modified barium swallow study   Assessment and Plan: Mary Giraldo Allenis a 85 y.o.femalepresenting with altered mental status and hallucinations. PMH is significant forhypertension, GERD, hyperlipidemia, urinary incontinence, history of stroke, and poor appetite.  Altered mental status, hallucinations Patient seems to be alert again this morning, able to report location and her name, that she lives with her son and daughter in law and states why she is in the hospital. Patient still unable to state the year and chooses the correct president when given a list of choices.   Patient was afebrile overnight.  Concerns for patient's swallowing ability with increased secretions yesterday so now patient is NPO and undergoing MBS, taking medications in puree for now.  -s/p MBS; rec dysphagia 2 diet with thin liquids, whole meds with liquids  -Risperdal 0.25mg  PRN listed  -antibiotics for PNAas listed below  UTI, improved Patient no longer endorses suprapubic pain upon abdominal palpation. Patient  WBC has decreased to 14 today. Patient has remained afebrile overnight.  We believe  that this is contributing to patient's altered mental status. Repeat urine cultures with no growth to date.  -patient has received 5 days of CTX for this, will plan to discontinue today   PNA, likely aspiration pneumonia  Patient has been afebrile overnight, now for 48 hours.  We will plan to continue antibiotic therapy. On exam,patienthasimproved respiratory status. Patient appears to be aerating well on physical exam, I do not appreciate crackles nor rhonchi as well as no increased WOB, continues to be stable on RA sat 99% on RA, WBC decreased daily for 2 additional days, patient has been on CTX and Vanc for 5 day total on abx. Also s/p 1 dose of azithromycin. Will plan for 7 day course of antibiotics. -start 500mg  with azithromycin for 2 days -monitor respiratory status  -discontinue Vanc -Repeat blood cultures are negative  -monitor WBC   Hypokalemia,resolved Patient with potassium of 4 WNL After replenishing K and magnesium.   AKI Patient with decreased Cr of 1.13 from 1.2 overnight.  Will continue to monitor this with creatinine levels in the a.m. Patient on maintenance IV fluids at 150 mL/h -Continue maintenance IV fluids -Continue to monitor creatinine with BMP  Atrial fibrillation Spoke with patient's family regarding history of atrial fibrillation and was confirmed that this is a chronic issue, family was not aware of any prior anticoagulation.Patient has remained in Atrial fibrillation throughout admission. Will plan to start anticoagulation with Eliquis. CHADVASC score of 6. -continue coreg 3.125 for rate control of AF  -start eliquis 5mg  BID pending discussion with patient's family   Hypertension Patient BPovernight ranging from 108-155/46-84. Most recently 141/64.Patient home medications include ZESTORETIC. -Continue to monitor blood pressure with vitals -Continue Coreg 3.125 twice daily -We will plan to have patient restarted on ACEi at follow up  appointment upon discharge  Left Ankle Pain Patient denies recent injury to left ankle but states the pain is present when she applies any pressure to the ankle. On exam, it has no erythema but does appear to have some edema. Given no recent trauma and that patient has been bed bound during this admission when pain began, it is less likely a fracture concern. Will complete left ankle xray to verify no bone abnormalities  -will give Tylenol for pain  -left ankle xray  Patient with QTC prolongation  -Holding QTC prolonging medications -Patient on cardiac monitoring  History of a fall Patient is reported to have had a fall while living in [place] and has residual right lower extremity weakness -Patient on fall precautions -PT/OT recommending SNF upon discharge  GERD Home medications include Protonix 20 and Pepcid 20 -We will hold these medications as patient is not tolerating p.o. at this time nor symptomatic   FEN/GI:N.p.o., maintenance fluids PYK:DXIPJAS 5000 units  Disposition: Discharge pending medical stabilization, patient's family has selected SNF as recommended by PT/OT at discharge   Subjective:  Patient reports having some ankle pain on the left that hurts with pressure and with movement. Patient denies hurting her ankle recently.   Objective: Temp:  [97.5 F (36.4 C)-98.7 F (37.1 C)] 97.5 F (36.4 C) (10/16 0909) Pulse Rate:  [60-79] 78 (10/16 0909) Resp:  [18-22] 22 (10/16 0909) BP: (108-155)/(46-84) 141/64 (10/16 0909) SpO2:  [92 %-100 %] 99 % (10/16 0909)  Physical Exam: General: elderly female sitting up in bed in NAD on RA  Cardiovascular: irregularly, irregular on auscultation  Respiratory: aerating well, no increased WOB, no crackles nor rhonchi appreciated on exam  Abdomen: nontender to palpation, bowel sounds present, soft abdomen  Extremities: no edema, left ankle with tenderness over lateral malleolus, no erythema, overall deconditioning of  bilateral lower extremities   Laboratory: Recent Labs  Lab 09/14/19 0424 09/15/19 0313 09/16/19 0703  WBC 15.8* 18.8* 14.4*  HGB 11.6* 10.5* 9.6*  HCT 35.7* 31.7* 30.5*  PLT 204 201 195   Recent Labs  Lab 09/12/19 0200 09/12/19 0926  09/14/19 0424 09/15/19 0313 09/16/19 0441  NA 142 143   < > 142 141 144  K 3.8 3.3*   < > 3.3* 3.1* 4.0  CL 106 106   < > 105 108 114*  CO2 24 24   < > 21* 17* 16*  BUN 23 22   < > 10 18 25*  CREATININE 1.39* 1.18*   < > 0.99 1.21* 1.13*  CALCIUM 9.9 9.8   < > 8.6* 8.3* 8.4*  PROT 7.9 7.3  --   --   --   --   BILITOT 0.9 0.9  --   --   --   --   ALKPHOS 57 52  --   --   --   --   ALT 12 12  --   --   --   --   AST 25 27  --   --   --   --   GLUCOSE 113* 113*   < > 91 98 78   < > = values in this interval not displayed.    Imaging/Diagnostic Tests: No new imaging   Stark Klein, MD 09/16/2019, 10:42 AM PGY-1, [place] Intern pager: [phone], text pages welcome

## 2019-09-16 NOTE — Progress Notes (Signed)
Modified Barium Swallow Progress Note  Patient Details  Name: Mary Chavez MRN: [id number] Date of Birth: [birth year]  Today's Date: 09/16/2019  Modified Barium Swallow completed.  Full report located under Chart Review in the Imaging Section.  Brief recommendations include the following:  Clinical Impression  Pt presents with mild oropharyngeal dysphagia c/b reduced lingual coordination, premature spillage, and delayed swallow initiation.  These deficits resulted in lingual pumping with all consistencies, prolonged oral phase and piecemeal deglutition with regular solids, and intermittent trace, transient, very shallow penetration of thin liquid. Swallow initiation was moderately-severely delayed with thin liquid spilling to pyriform sinuses prior to the swallow, but there was no penetration before swallow.  Although there were no pharyngeal deficits noted with solids, oral and cognitive deficits suggest pt would benefit from modified texture diet.  Pt was very reluctant to accept PO trials during MBSS and required repeat encouragement and maximal cuing to participate.  Participation level may impact PO intake with meals as well.  Recommend ground consistency diet with thin liquid.   Swallow Evaluation Recommendations       SLP Diet Recommendations: Dysphagia 2 (Fine chop) solids;Thin liquid   Liquid Administration via: Cup;Straw   Medication Administration: Whole meds with liquid   Supervision: Full assist for feeding   Compensations: Minimize environmental distractions;Slow rate;Small sips/bites   Postural Changes: Seated upright at 90 degrees   Oral Care Recommendations: Oral care BID        Celedonio Savage, [place], [place] Office: [phone]; Pager [phone]): [phone] 09/16/2019,12:03 PM

## 2019-09-17 DIAGNOSIS — J690 Pneumonitis due to inhalation of food and vomit: Secondary | ICD-10-CM

## 2019-09-17 LAB — BASIC METABOLIC PANEL
Anion gap: 14 (ref 5–15)
BUN: 23 mg/dL (ref 8–23)
CO2: 18 mmol/L — ABNORMAL LOW (ref 22–32)
Calcium: 8.8 mg/dL — ABNORMAL LOW (ref 8.9–10.3)
Chloride: 110 mmol/L (ref 98–111)
Creatinine, Ser: 1.16 mg/dL — ABNORMAL HIGH (ref 0.44–1.00)
GFR calc Af Amer: 48 mL/min — ABNORMAL LOW (ref >60)
GFR calc non Af Amer: 42 mL/min — ABNORMAL LOW (ref >60)
Glucose, Bld: 88 mg/dL (ref 70–99)
Potassium: 3.2 mmol/L — ABNORMAL LOW (ref 3.5–5.1)
Sodium: 142 mmol/L (ref 135–145)

## 2019-09-17 LAB — CBC
HCT: 29.5 % — ABNORMAL LOW (ref 36.0–46.0)
Hemoglobin: 9.4 g/dL — ABNORMAL LOW (ref 12.0–15.0)
MCH: 26.4 pg (ref 26.0–34.0)
MCHC: 31.9 g/dL (ref 30.0–36.0)
MCV: 82.9 fL (ref 80.0–100.0)
Platelets: 237 10*3/uL (ref 150–400)
RBC: 3.56 MIL/uL — ABNORMAL LOW (ref 3.87–5.11)
RDW: 17.2 % — ABNORMAL HIGH (ref 11.5–15.5)
WBC: 14.6 10*3/uL — ABNORMAL HIGH (ref 4.0–10.5)
nRBC: 0 % (ref 0.0–0.2)

## 2019-09-17 LAB — CULTURE, BLOOD (ROUTINE X 2)
Culture: NO GROWTH
Culture: NO GROWTH
Special Requests: ADEQUATE

## 2019-09-17 MED ORDER — POTASSIUM CHLORIDE 10 MEQ/100ML IV SOLN
10.0000 meq | INTRAVENOUS | Status: AC
  Administered 2019-09-17 (×4): 10 meq via INTRAVENOUS
  Filled 2019-09-17 (×4): qty 100

## 2019-09-17 NOTE — Progress Notes (Signed)
Pt recently flipped from SR to a-fib, HR in low 100's. MEWS to yellow, on call Family medicine intern notified. As RN was sending message, pt went back to SR, HR in 90's. Family medicine call back, states they are aware, pt on eliquis as a result, and no new orders at this time. Pt resting quietly in bed, no s/s of distress noted.

## 2019-09-17 NOTE — Progress Notes (Addendum)
Family Medicine Teaching Service Daily Progress Note Intern Pager: [phone]  Patient name: Mary Chavez Medical record number: [id number] Date of birth: 1928-01-19 Age: 85 y.o. Gender: female  Primary Care Provider: Patriciaann Clan, DO Consultants: SLP,Psychiatry (s/o) Code Status: Full Code   Pt Overview and Major Events to Date: 10/12: Patient admitted with UTI, questionable pneumonia, altered mental status, hallucinations,ceftriaxone and azithromycin started 10/13: Patient treated with ceftriaxone for UTI, continued confusion 10/14: Fever, new retrocardiac chest x-ray finding, broadened to vancomycin,ceftriaxone continuedfor UTI 10/15: Vanc day 3, CTX day 4, Day 2 flagyll(d/c), improved mentation but then regressed mentation  10/16- transitioned to PO abx for PNA, discontinued vanc and CTX, NPO for modified barium swallow study   Assessment and Plan: Mary Haseman Allenis a 85 y.o.femalepresenting with altered mental status and hallucinations. PMH is significant forhypertension, GERD, hyperlipidemia, urinary incontinence, history of stroke, and poor appetite.  Altered mental status, hallucinations Patient alert only to person today.  Not to place or time. -Patient afebrile overnight -Dysphagia 2 diet -Risperdal 0.25mg  PRN listed  -antibiotics for PNAas listed below  UTI, improved No suprapubic pain on palpation.  Current WBC 14.6.  Patient afebrile overnight.    UTI is likely contributing to patient's altered mental status.  Urine cultures no growth.  -Ceftriaxone discontinued.  PNA, likely aspiration pneumonia  Patient continues to be afebrile overnight.  We will plan to continue antibiotic therapy. WBC decreased daily for 2 additional days, patient has been on CTX and Vanc for 5 day total on abx. Both of these have been discontinued. Also s/p 1 dose of azithromycin.  -Azithromycin 500 mg, day 2 of 2 -Cefdinir Day 1 of 2 -monitor respiratory status  -Repeat  blood cultures are negative  -monitor WBC  Hypokalemia,resolved Current potassium 3.2.  Replaced with 4 runs of IV potassium chloride.  AKI Patient with decreased Cr of 1.16 from 1.21.  Patient on maintenance IV fluids at 161mL/h -Continue maintenance IV fluids -Daily BMP  Atrial fibrillation Spoke with patient's family regarding history of atrial fibrillation and was confirmed that this is a chronic issue, family was not aware of any prior anticoagulation.Patient has remained in Atrial fibrillation throughout admission. Will plan to start anticoagulation with Eliquis. CHADVASC score of 6. -continue coreg 3.125 for rate control of AF  -start eliquis 5mg  BID   Hypertension Patient BPovernight ranging from 116-163/65-111. Patient home medications include lisinopril-hydrochlorothiazide 20-25 mg twice daily. -Continue to monitor blood pressure with vitals -Continue Coreg 3.125 twice daily -We will plan to have patient restarted on ACEi at follow up appointment upon discharge  Left Ankle Pain Left ankle x-ray showed no acute bony abnormality.  Patient denies recent injury to left ankle but states the pain is present when she applies any pressure to the ankle. On exam, it has no erythema but does appear to have some edema. Given no recent trauma and that patient has been bed bound during this admission when pain began, it is less likely a fracture concern. -will give Tylenol for pain   Patient with QTC prolongation  -Holding QTC prolonging medications -Patient on cardiac monitoring  History of a fall Patient is reported to have had a fall while living in [place] and has residual right lower extremity weakness -Patient on fall precautions -PT/OT recommending SNF upon discharge  GERD Home medications include Protonix 20 and Pepcid 20 -We will hold these medications as patient is not tolerating p.o. at this time nor symptomatic   FEN/GI:Dysphagia 2 diet VVO:HYWVPXT 5 mg  twice daily  Disposition: Discharge pending medical stabilization, patient's family has selected SNF as recommended by PT/OT at discharge   Subjective:  Patient lying in bed with some confusion.  Does not appear to be endorsing any hallucinations at this time.  Patient difficult to understand when speaking.  States that she still has had some bouts of diarrhea  Objective: Temp:  [97.5 F (36.4 C)-98.7 F (37.1 C)] 98.6 F (37 C) (10/16 2341) Pulse Rate:  [71-91] 91 (10/16 2341) Resp:  [16-22] 18 (10/16 2341) BP: (116-155)/(57-70) 148/65 (10/16 2341) SpO2:  [98 %-100 %] 98 % (10/16 2341)  Physical Exam: General: Alert and oriented in no apparent distress Heart: Regular rate and rhythm with no murmurs appreciated Lungs: CTA bilaterally, no wheezing Abdomen: Bowel sounds present, some abdominal discomfort to palpation in central abdomen, nondistended, no acute surgical findings.   Laboratory: Recent Labs  Lab 09/14/19 0424 09/15/19 0313 09/16/19 0703  WBC 15.8* 18.8* 14.4*  HGB 11.6* 10.5* 9.6*  HCT 35.7* 31.7* 30.5*  PLT 204 201 195   Recent Labs  Lab 09/12/19 0200 09/12/19 0926  09/14/19 0424 09/15/19 0313 09/16/19 0441  NA 142 143   < > 142 141 144  K 3.8 3.3*   < > 3.3* 3.1* 4.0  CL 106 106   < > 105 108 114*  CO2 24 24   < > 21* 17* 16*  BUN 23 22   < > 10 18 25*  CREATININE 1.39* 1.18*   < > 0.99 1.21* 1.13*  CALCIUM 9.9 9.8   < > 8.6* 8.3* 8.4*  PROT 7.9 7.3  --   --   --   --   BILITOT 0.9 0.9  --   --   --   --   ALKPHOS 57 52  --   --   --   --   ALT 12 12  --   --   --   --   AST 25 27  --   --   --   --   GLUCOSE 113* 113*   < > 91 98 78   < > = values in this interval not displayed.    Imaging/Diagnostic Tests: No new imaging   Jackelyn Poling, DO 09/17/2019, 12:08 AM PGY-1, [facility] Family Medicine FPTS Intern pager: [phone], text pages welcome

## 2019-09-17 NOTE — Progress Notes (Addendum)
Family Medicine Teaching Service Daily Progress Note Intern Pager: [phone]  Patient name: Mary Chavez Medical record number: [id number] Date of birth: [birth year] Age: 85 y.o. Gender: female  Primary Care Provider: Allayne Stack, DO Consultants: SLP,Psychiatry (s/o) Code Status: Full Code   Pt Overview and Major Events to Date: 10/12: Patient admitted with UTI, questionable pneumonia, altered mental status, hallucinations,ceftriaxone and azithromycin started 10/13: Patient treated with ceftriaxone for UTI, continued confusion 10/14: Fever, new retrocardiac chest x-ray finding, broadened to vancomycin,ceftriaxone continuedfor UTI 10/15: Vanc day 3, CTX day 4, Day 2 flagyll(d/c), improved mentation but then regressed mentation  10/16- transitioned to PO abx for PNA, discontinued vanc and CTX, MBSS with dysphagia 2 diet  Assessment and Plan: Mary Hornberger Allenis a 85 y.o.femalepresenting with altered mental status and hallucinations. PMH is significant forhypertension, GERD, hyperlipidemia, urinary incontinence, history of stroke, and poor appetite.  Altered mental status, hallucinations: Stable Alert to person, aware that she is in [facility], but does not know that she is in the hospital, does not know date or situation.  Afebrile overnight. -Dysphagia 2 diet -Risperdal 0.25mg  PRN listed  -antibiotics for PNAas listed below  UTI, improved No suprapubic pain on palpation.  Urine cultures no growth, WBC this a.m. pending.  Has remained afebrile overnight. -S/p ceftriaxone (10/12-10/16) -Cefdinir day 2/2  PNA, likely aspiration pneumonia  S/p azithromycin, ceftriaxone, vancomycin.  Remains afebrile overnight.  Breathing comfortably on room air.  WBC this a.m. pending. -S/p azithromycin, s/p ceftriaxone, s/p vancomycin -Cefdinir day 2/2 -Repeat blood cultures on 10/14: NG3D -monitor respiratory status  -monitor WBC  Hypokalemia: Pending this AM K  3.2>pending this AM - monitor BMP  AKI: Improving Cr 1.16> pending this AM.   -Continue maintenance IV fluids -Daily BMP  Atrial fibrillation Spoke with patient's family regarding history of atrial fibrillation and was confirmed that this is a chronic issue, family was not aware of any prior anticoagulation.Patient has remained in Atrial fibrillation throughout admission.  Anticoagulation with Eliquis started during hospitalization.  While patient's age meets criteria for decreased dosing of Eliquis, her weight and creatinine do not, therefore should be on 5 mg twice daily.  CHADVASC score of 6. -continue coreg 3.125 for rate control of AF  -Continue eliquis 5mg  BID   Hypertension: stable BP this a.m.157/97, is intermittently mildly hypertensive, 175/89 at 0030.  Patient home medications include lisinopril-hydrochlorothiazide 20-25 mg twice daily. -Continue to monitor blood pressure with vitals -Continue Coreg 3.125 twice daily - will restart HCTZ at 25mg  QD and watch K -We will plan to have patient restarted on ACEi at follow up appointment upon discharge  Left Ankle Pain Left ankle x-ray showed no acute bony abnormality.  Patient denies recent injury to left ankle but states the pain is present when she applies any pressure to the ankle. On exam, it has no erythema but does appear to have some edema. Given no recent trauma and that patient has been bed bound during this admission when pain began, it is less likely a fracture concern. -Tylenol PRN pain  Patient with QTC prolongation  EKG on 10/14 with QTC of 460, although patient noted to be in A. fib at that time, therefore QTC likely not accurate. -Holding QTC prolonging medications -Patient on cardiac monitoring  History of a fall Patient is reported to have had a fall while living in and has residual right lower extremity weakness -Patient on fall precautions -PT/OT recommending SNF upon discharge  GERD Home  medications include  Protonix 20 and Pepcid 20.  Mild tenderness to palpation of epigastric region, given this, will restart Pepcid. -Holding Protonix as patient was not tolerating p.o. on admission -Will restart Pepcid 20 mg daily today.  FEN/GI:Dysphagia 2 diet HDQ:QIWLNLG 5 mg twice daily  Disposition:  Patient is medically cleared for discharge to SNF.  Pending SNF placement.  Subjective:  Patient reports that she is feeling well this morning.  No complaints.  Objective: Temp:  [98.3 F (36.8 C)-98.8 F (37.1 C)] 98.5 F (36.9 C) (10/17 1943) Pulse Rate:  [82-95] 82 (10/17 1943) Resp:  [16-18] 16 (10/17 1943) BP: (148-163)/(65-133) 159/104 (10/17 1943) SpO2:  [97 %-100 %] 98 % (10/17 1943) Weight:  [78.9 kg] 78.9 kg (10/17 0404)  Physical Exam: General: 85 y.o. female in NAD Cardio: RRR no m/r/g Lungs: CTAB, no wheezing, no rhonchi, no crackles, no IWOB on RA Abdomen: Soft, mild tenderness to palpation epigastric region, non-distended, positive bowel sounds Skin: warm and dry Extremities: No edema    Laboratory: Recent Labs  Lab 09/15/19 0313 09/16/19 0703 09/17/19 0210  WBC 18.8* 14.4* 14.6*  HGB 10.5* 9.6* 9.4*  HCT 31.7* 30.5* 29.5*  PLT 201 195 237   Recent Labs  Lab 09/12/19 0200 09/12/19 0926  09/15/19 0313 09/16/19 0441 09/17/19 0210  NA 142 143   < > 141 144 142  K 3.8 3.3*   < > 3.1* 4.0 3.2*  CL 106 106   < > 108 114* 110  CO2 24 24   < > 17* 16* 18*  BUN 23 22   < > 18 25* 23  CREATININE 1.39* 1.18*   < > 1.21* 1.13* 1.16*  CALCIUM 9.9 9.8   < > 8.3* 8.4* 8.8*  PROT 7.9 7.3  --   --   --   --   BILITOT 0.9 0.9  --   --   --   --   ALKPHOS 57 52  --   --   --   --   ALT 12 12  --   --   --   --   AST 25 27  --   --   --   --   GLUCOSE 113* 113*   < > 98 78 88   < > = values in this interval not displayed.    Imaging/Diagnostic Tests: No new imaging   Cleophas Dunker, DO 09/17/2019, 8:59 PM PGY-2, [place] Intern pager: [phone], text pages welcome

## 2019-09-18 LAB — BASIC METABOLIC PANEL
Anion gap: 15 (ref 5–15)
BUN: 18 mg/dL (ref 8–23)
CO2: 18 mmol/L — ABNORMAL LOW (ref 22–32)
Calcium: 8.9 mg/dL (ref 8.9–10.3)
Chloride: 113 mmol/L — ABNORMAL HIGH (ref 98–111)
Creatinine, Ser: 0.96 mg/dL (ref 0.44–1.00)
GFR calc Af Amer: >60 mL/min (ref >60)
GFR calc non Af Amer: 52 mL/min — ABNORMAL LOW (ref >60)
Glucose, Bld: 88 mg/dL (ref 70–99)
Potassium: 4 mmol/L (ref 3.5–5.1)
Sodium: 146 mmol/L — ABNORMAL HIGH (ref 135–145)

## 2019-09-18 LAB — CBC
HCT: 29.5 % — ABNORMAL LOW (ref 36.0–46.0)
Hemoglobin: 9.2 g/dL — ABNORMAL LOW (ref 12.0–15.0)
MCH: 26.1 pg (ref 26.0–34.0)
MCHC: 31.2 g/dL (ref 30.0–36.0)
MCV: 83.6 fL (ref 80.0–100.0)
Platelets: 198 10*3/uL (ref 150–400)
RBC: 3.53 MIL/uL — ABNORMAL LOW (ref 3.87–5.11)
RDW: 17.3 % — ABNORMAL HIGH (ref 11.5–15.5)
WBC: 11.9 10*3/uL — ABNORMAL HIGH (ref 4.0–10.5)
nRBC: 0.2 % (ref 0.0–0.2)

## 2019-09-18 MED ORDER — HYDROCHLOROTHIAZIDE 25 MG PO TABS
25.0000 mg | ORAL_TABLET | Freq: Every day | ORAL | Status: DC
  Administered 2019-09-18 – 2019-09-23 (×6): 25 mg via ORAL
  Filled 2019-09-18 (×6): qty 1

## 2019-09-18 MED ORDER — FAMOTIDINE 20 MG PO TABS
20.0000 mg | ORAL_TABLET | Freq: Every day | ORAL | Status: DC
  Administered 2019-09-18 – 2019-09-23 (×6): 20 mg via ORAL
  Filled 2019-09-18 (×6): qty 1

## 2019-09-19 LAB — CBC
HCT: 31.2 % — ABNORMAL LOW (ref 36.0–46.0)
Hemoglobin: 10.2 g/dL — ABNORMAL LOW (ref 12.0–15.0)
MCH: 26.8 pg (ref 26.0–34.0)
MCHC: 32.7 g/dL (ref 30.0–36.0)
MCV: 81.9 fL (ref 80.0–100.0)
Platelets: 277 10*3/uL (ref 150–400)
RBC: 3.81 MIL/uL — ABNORMAL LOW (ref 3.87–5.11)
RDW: 16.9 % — ABNORMAL HIGH (ref 11.5–15.5)
WBC: 10.3 10*3/uL (ref 4.0–10.5)
nRBC: 0 % (ref 0.0–0.2)

## 2019-09-19 LAB — BASIC METABOLIC PANEL
Anion gap: 12 (ref 5–15)
BUN: 13 mg/dL (ref 8–23)
CO2: 21 mmol/L — ABNORMAL LOW (ref 22–32)
Calcium: 9.2 mg/dL (ref 8.9–10.3)
Chloride: 110 mmol/L (ref 98–111)
Creatinine, Ser: 0.93 mg/dL (ref 0.44–1.00)
GFR calc Af Amer: >60 mL/min (ref >60)
GFR calc non Af Amer: 54 mL/min — ABNORMAL LOW (ref >60)
Glucose, Bld: 83 mg/dL (ref 70–99)
Potassium: 3.5 mmol/L (ref 3.5–5.1)
Sodium: 143 mmol/L (ref 135–145)

## 2019-09-19 LAB — CULTURE, BLOOD (ROUTINE X 2)
Culture: NO GROWTH
Culture: NO GROWTH

## 2019-09-19 LAB — SARS CORONAVIRUS 2 (TAT 6-24 HRS): SARS Coronavirus 2: NEGATIVE

## 2019-09-19 MED ORDER — ACETAMINOPHEN 325 MG PO TABS
650.0000 mg | ORAL_TABLET | Freq: Four times a day (QID) | ORAL | Status: DC | PRN
  Administered 2019-09-19 – 2019-09-20 (×2): 650 mg via ORAL
  Filled 2019-09-19 (×2): qty 2

## 2019-09-19 NOTE — TOC Progression Note (Signed)
Transition of Care [facility]) - Progression Note    Patient Details  Name: Mary Chavez MRN: [id number] Date of Birth: [birth year]  Transition of Care [facility]) CM/SW [place], LCSW Phone Number: 09/19/2019, 10:32 AM  Clinical Narrative:    Per Wandra Feinstein, Insurance requesting updated PT note. CSW notes PT is on the schedule to see patient today. SNF also requesting an updated COVID. CSW notified MD.    Expected Discharge Plan: Skilled Nursing Facility Barriers to Discharge: Ship broker, Continued Medical Work up  Expected Discharge Plan and Services Expected Discharge Plan: [employer] In-house Referral: Clinical Social Work Discharge Planning Services: CM Consult Post Acute Care Choice: [place] Living arrangements for the past 2 months: Apartment                   DME Agency: (owns walker , W/C)         [place] Agency: NA         Social Determinants of Health (SDOH) Interventions    Readmission Risk Interventions No flowsheet data found.

## 2019-09-19 NOTE — TOC Progression Note (Addendum)
Transition of Care [facility]) - Progression Note    Patient Details  Name: Mary Chavez MRN: [id number] Date of Birth: [birth year]  Transition of Care [facility]) CM/SW [place], LCSW Phone Number: 09/19/2019, 4:53 PM  Clinical Narrative:    [facility] has denied patient for SNF. They are offering a peer to peer with their physician by 12pm Tuesday; MD notified: Dr. Harrel Carina Phone: [phone] Ref# [id number]   Expected Discharge Plan: [place] Barriers to Discharge: Insurance Authorization  Expected Discharge Plan and Services Expected Discharge Plan: [place] In-house Referral: Clinical Social Work Discharge Planning Services: CM Consult Post Acute Care Choice: [place] Living arrangements for the past 2 months: Apartment                   DME Agency: (owns walker , W/C)         [place] Agency: NA         Social Determinants of Health (SDOH) Interventions    Readmission Risk Interventions No flowsheet data found.

## 2019-09-19 NOTE — Care Management Important Message (Signed)
Important Message  Patient Details  Name: Mary Chavez MRN: [id number] Date of Birth: June 19, 1928   Medicare Important Message Given:  Yes     Williamson Cavanah 09/19/2019, 3:30 PM

## 2019-09-19 NOTE — Progress Notes (Signed)
Physical Therapy Treatment Patient Details Name: Mary Chavez MRN: [id number] DOB: 12-20-1927 Today's Date: 09/19/2019    History of Present Illness Pt is an 85 y/o female admitted secondary to AMS and hallucinations. Workup pending. MRI negative for acute abnormality, however, did showe chronic encephalomacia. PMH includes HTN, CVA, and a fib.     PT Comments    Pt performed basic bed mobility and squat pivot transfer from bed to recliner.  Pt is more painful this session.  She is also more alert.  She is requiring total assistance for all aspects of mobility with very little input on her part.  She is able to follow commands for hand placement but lacks strength to assist in transfers.  Continue to recommend SNF placement at this time.  Informed nursing of need for B prevalon boots.     Follow Up Recommendations  SNF;Supervision/Assistance - 24 hour     Equipment Recommendations  Other (comment)(TBD)    Recommendations for Other Services       Precautions / Restrictions Precautions Precautions: Fall Restrictions Weight Bearing Restrictions: No    Mobility  Bed Mobility Overal bed mobility: Needs Assistance Bed Mobility: Rolling;Supine to Sit;Sit to Supine Rolling: Total assist;+2 for physical assistance   Supine to sit: Total assist;+2 for safety/equipment     General bed mobility comments: Pt performed rolling to R and to L sides to placed on bedpan for need to urinate.  Pt also noted to have bowel incontinence.during rolling.  Performed perianal care and positioned into sitting,.  Pt able to hold self unsupported sitting but reported pain in B feet with weight bearing to balance, and required total assistance to scoot to edge of bed to prepare for transfer.  Transfers Overall transfer level: Needs assistance Equipment used: None Transfers: Squat Pivot Transfers     Squat pivot transfers: Total assist;+2 physical assistance     General transfer comment: Pt  remains weak and required total +2 to  move form bed to recliner.  Unable to achieve full standing at this time.  Ambulation/Gait Ambulation/Gait assistance: (NT unable to achieve standing.)               Stairs             Wheelchair Mobility    Modified Rankin (Stroke Patients Only)       Balance Overall balance assessment: Needs assistance Sitting-balance support: No upper extremity supported;Feet supported Sitting balance-Leahy Scale: Poor Sitting balance - Comments: Pt intermittently able to hold her balance for 30-60 sec.  She did presents with lateral LOB and required min-mod to correct.     Standing balance-Leahy Scale: Zero Standing balance comment: only able to squat pivot no ability to achieve full standing.                            Cognition Arousal/Alertness: Awake/alert Behavior During Therapy: Anxious Overall Cognitive Status: Impaired/Different from baseline                                 General Comments: Not formally assessed as pt in so much pain.      Exercises      General Comments        Pertinent Vitals/Pain Pain Assessment: Faces Faces Pain Scale: Hurts whole lot Pain Location: Generalized but noted more in R shoulder and B feet/ heel soft to touch. Pain  Descriptors / Indicators: Grimacing;Moaning;Tender;Sore Pain Intervention(s): Monitored during session;Repositioned;Relaxation;Limited activity within patient's tolerance    Home Living                      Prior Function            PT Goals (current goals can now be found in the care plan section) Acute Rehab PT Goals Patient Stated Goal: Unstated PT Goal Formulation: Patient unable to participate in goal setting Potential to Achieve Goals: Fair Progress towards PT goals: Progressing toward goals    Frequency    Min 2X/week      PT Plan Current plan remains appropriate    Co-evaluation              AM-PAC PT "6  Clicks" Mobility   Outcome Measure  Help needed turning from your back to your side while in a flat bed without using bedrails?: Total Help needed moving from lying on your back to sitting on the side of a flat bed without using bedrails?: Total Help needed moving to and from a bed to a chair (including a wheelchair)?: Total Help needed standing up from a chair using your arms (e.g., wheelchair or bedside chair)?: Total Help needed to walk in hospital room?: Total Help needed climbing 3-5 steps with a railing? : Total 6 Click Score: 6    End of Session Equipment Utilized During Treatment: Gait belt Activity Tolerance: Treatment limited secondary to agitation Patient left: with call bell/phone within reach;in chair;with chair alarm set Nurse Communication: Mobility status(need for lateral scoot with pad back to bed.) PT Visit Diagnosis: Other abnormalities of gait and mobility (R26.89);Unsteadiness on feet (R26.81);Muscle weakness (generalized) (M62.81)     Time: 1610-9604 PT Time Calculation (min) (ACUTE ONLY): 20 min  Charges:  $Therapeutic Activity: 8-22 mins                     Governor Rooks, PTA Acute Rehabilitation Services Pager [phone] Office [phone]     Duru Reiger Eli Hose 09/19/2019, 10:54 AM

## 2019-09-19 NOTE — Progress Notes (Signed)
Family Medicine Teaching Service Daily Progress Note Intern Pager: [phone]  Patient name: Mary Chavez Medical record number: [id number] Date of birth: Mar 06, 1928 Age: 85 y.o. Gender: female  Primary Care Provider: Patriciaann Clan, DO Consultants: SLP,Psychiatry (s/o) Code Status: Full Code   Pt Overview and Major Events to Date: 10/12: Patient admitted with UTI, questionable pneumonia, altered mental status, hallucinations,ceftriaxone and azithromycin started 10/13: Patient treated with ceftriaxone for UTI, continued confusion 10/14: Fever, new retrocardiac chest x-ray finding, broadened to vancomycin,ceftriaxone continuedfor UTI 10/15: Vanc day 3, CTX day 4, Day 2 flagyll(d/c), improved mentation but then regressed mentation  10/16- transitioned to PO abx for PNA, discontinued vanc and CTX, MBSS with dysphagia 2 diet 10/18- completed 7 day course of abx for PNA   Assessment and Plan: Mary Wareing Allenis a 85 y.o.femalepresenting with altered mental status and hallucinations. PMH is significant forhypertension, GERD, hyperlipidemia, urinary incontinence, history of stroke, and poor appetite.  Altered mental status, hallucinations: Stable Alert to person, aware that she is in [facility]. Patient is oriented to self. She reports neck pain and does not rotate her neck towards her right. Afebrile overnight. Blood cultures remain neg to date.  -continue to work with  -Dysphagia 2 diet -Risperdal 0.25mg  PRN listed  -antibiotics for PNA/UTI completed to date  -patient pending SNF placement upon discharge, will repeat COVID-19 test   UTI, improved No suprapubic pain on palpation.  Urine cultures no growth, WBC this a.m. 10.3 WNL.  Has remained afebrile overnight. -S/p ceftriaxone (10/12-10/16) -Cefdinir completed, total 7 day abx course   PNA, likely aspiration pneumonia  S/p azithromycin, ceftriaxone, vancomycin and cefdinir.  Remains afebrile overnight.   Breathing comfortably on room air.  WBC this a.m.10.3. -S/p azithromycin, s/p ceftriaxone, s/p vancomycin -Cefdinir completed for total 7 days course of abx -Repeat blood cultures on 10/14: NG3D -monitor respiratory status  -monitor WBC  Hypokalemia: Pending this AM K 3.5, WNL. Will continue to monitor  - monitor BMP  AKI: Improving Cr 1.16> pending this AM.   -Continue maintenance IV fluids -Daily BMP  Atrial fibrillation Spoke with patient's family regarding history of atrial fibrillation and was confirmed that this is a chronic issue, family was not aware of any prior anticoagulation. CHADVASC score of 6. -continue coreg 3.125 for rate control of AF  -Continue eliquis 5mg  BID   Hypertension: stable BP this a.m. 140-169/58-81 overnight, 140/78 most recently.  Patient home medications include lisinopril-hydrochlorothiazide 20-25 mg twice daily. Cr. 0.93 -Continue to monitor blood pressure with vitals -Continue Coreg 3.125 twice daily - started HCTZ at 25mg  QD and monitoring K -We will plan to have patient restarted on ACEi and diuretic combination pill upon discharge  Left Ankle Pain, resolved Left ankle x-ray showed no acute bony abnormality.  Patient denies any ankle pain today.  -continue to monitor for pain  -Tylenol PRN pain  Patient with QTC prolongation  EKG on 10/14 with QTC of 460, although patient noted to be in A. fib at that time, therefore QTC likely not accurate. -Holding QTC prolonging medications -Patient on cardiac monitoring  History of a fall Patient is reported to have had a fall while living in [place] and has residual right lower extremity weakness -Patient on fall precautions -PT/OT recommending SNF upon discharge  GERD Home medications include Protonix 20 and Pepcid 20.  Mild tenderness to palpation of epigastric region, given this, will restart Pepcid. -Holding Protonix as patient was not tolerating p.o. on admission -Will restart Pepcid  20  mg daily today.  FEN/GI:Dysphagia 2 diet [id number] 5 mg twice daily  Disposition:pending SNF placement and insurance authorization, will repeat COVID testing  Subjective:  Patient reports that she is feeling well this morning.  No complaints.  Objective: Temp:  [98.2 F (36.8 C)-99.5 F (37.5 C)] 99.5 F (37.5 C) (10/19 0350) Pulse Rate:  [68-79] 79 (10/19 0350) Resp:  [14-18] 18 (10/19 0350) BP: (140-169)/(58-81) 169/81 (10/19 0350) SpO2:  [100 %] 100 % (10/19 0350)  Physical Exam: General: female appearing, frail appearing, with head turned towards left, uncomfortable appearing Neck: no edema or erythema or signs concerning for trauma, muscles not tight to palpation, patient reports soreness with palpation of sternocleidomastoid muscles   Cardio: RRR no m/r/g Lungs: CTAB, no wheezing, no rhonchi, no crackles, no increased WOB on RA Abdomen: Soft, no tenderness to palpation, non-distended, positive bowel sounds Extremities: No edema  Laboratory: Recent Labs  Lab 09/16/19 0703 09/17/19 0210 09/18/19 0637  WBC 14.4* 14.6* 11.9*  HGB 9.6* 9.4* 9.2*  HCT 30.5* 29.5* 29.5*  PLT 195 237 198   Recent Labs  Lab 09/12/19 0926  09/16/19 0441 09/17/19 0210 09/18/19 0637  NA 143   < > 144 142 146*  K 3.3*   < > 4.0 3.2* 4.0  CL 106   < > 114* 110 113*  CO2 24   < > 16* 18* 18*  BUN 22   < > 25* 23 18  CREATININE 1.18*   < > 1.13* 1.16* 0.96  CALCIUM 9.8   < > 8.4* 8.8* 8.9  PROT 7.3  --   --   --   --   BILITOT 0.9  --   --   --   --   ALKPHOS 52  --   --   --   --   ALT 12  --   --   --   --   AST 27  --   --   --   --   GLUCOSE 113*   < > 78 88 88   < > = values in this interval not displayed.    Imaging/Diagnostic Tests: No new imaging   Mary [place], MD 09/19/2019, 6:05 AM PGY-2, [facility] Family Medicine FPTS Intern pager: [phone], text pages welcome

## 2019-09-20 LAB — CBC
HCT: 28.4 % — ABNORMAL LOW (ref 36.0–46.0)
Hemoglobin: 9.3 g/dL — ABNORMAL LOW (ref 12.0–15.0)
MCH: 26.6 pg (ref 26.0–34.0)
MCHC: 32.7 g/dL (ref 30.0–36.0)
MCV: 81.1 fL (ref 80.0–100.0)
Platelets: 295 10*3/uL (ref 150–400)
RBC: 3.5 MIL/uL — ABNORMAL LOW (ref 3.87–5.11)
RDW: 16.8 % — ABNORMAL HIGH (ref 11.5–15.5)
WBC: 11.7 10*3/uL — ABNORMAL HIGH (ref 4.0–10.5)
nRBC: 0 % (ref 0.0–0.2)

## 2019-09-20 LAB — BASIC METABOLIC PANEL
Anion gap: 10 (ref 5–15)
BUN: 15 mg/dL (ref 8–23)
CO2: 23 mmol/L (ref 22–32)
Calcium: 8.8 mg/dL — ABNORMAL LOW (ref 8.9–10.3)
Chloride: 108 mmol/L (ref 98–111)
Creatinine, Ser: 0.91 mg/dL (ref 0.44–1.00)
GFR calc Af Amer: >60 mL/min (ref >60)
GFR calc non Af Amer: 56 mL/min — ABNORMAL LOW (ref >60)
Glucose, Bld: 97 mg/dL (ref 70–99)
Potassium: 3.2 mmol/L — ABNORMAL LOW (ref 3.5–5.1)
Sodium: 141 mmol/L (ref 135–145)

## 2019-09-20 MED ORDER — ACETAMINOPHEN 325 MG PO TABS
650.0000 mg | ORAL_TABLET | Freq: Four times a day (QID) | ORAL | Status: AC
  Administered 2019-09-20 – 2019-09-21 (×4): 650 mg via ORAL
  Filled 2019-09-20 (×4): qty 2

## 2019-09-20 MED ORDER — POTASSIUM CHLORIDE CRYS ER 20 MEQ PO TBCR
40.0000 meq | EXTENDED_RELEASE_TABLET | Freq: Once | ORAL | Status: AC
  Administered 2019-09-20: 40 meq via ORAL
  Filled 2019-09-20: qty 2

## 2019-09-20 NOTE — Progress Notes (Signed)
  Speech Language Pathology Treatment: Dysphagia  Patient Details Name: Mary Chavez MRN: [id number] DOB: 1928/10/07 Today's Date: 09/20/2019 Time: 1215-1225 SLP Time Calculation (min) (ACUTE ONLY): 10 min  Assessment / Plan / Recommendation Clinical Impression  SLP follow up for assessment of diet tolerance and education following MBS completed 09/16/2019. Pt was seen at bedside during lunch. Pt was clearly uncomfortable, and indicated "I hurt all over". SLP attempted to reposition pt to make her more comfortable, but it didn't seem to help. Pt was resistant to po trials, but eventually accepted one bite of magic cup, and several sips of water. No anterior leakage noted, and no overt s/s aspiration observed on either texture. Pt declined additional presentations. ST will continue to follow briefly to assess diet tolerance and provide education, given minimal po trials accepted today. Pt is afebrile, and lungs are CTA per MD.   HPI HPI: Pt is a 85 Y/O F with PMX of  HTN, HLD, CVA brought in by the family for change in mental status and Hallucination, which started a few hours before presentation. Denies LOC, no fall, no facial asymmetry, no limb weakness.  Head CT and Brain MRI were clear for acute abnormalities, but revealed "chronic encephalomalacia" and "generalized brain atrophy".  CXR on 10/12 reported: "Findings suspicious for right middle lobe consolidation, possibly pneumonia".  No documented dysphagia hx.       SLP Plan  Continue with current plan of care       Recommendations  Diet recommendations: Dysphagia 2 (fine chop);Thin liquid Liquids provided via: Cup;Straw Medication Administration: Crushed with puree Supervision: Full supervision/cueing for compensatory strategies;Staff to assist with self feeding Compensations: Minimize environmental distractions;Slow rate;Small sips/bites Postural Changes and/or Swallow Maneuvers: Seated upright 90 degrees;Upright 30-60 min after  meal                Oral Care Recommendations: Oral care QID;Staff/trained caregiver to provide oral care Follow up Recommendations: 24 hour supervision/assistance;Skilled Nursing facility SLP Visit Diagnosis: Dysphagia, oropharyngeal phase (R13.12) Plan: Continue with current plan of care       GO              Celia B. Quentin Ore, [facility], [place] Speech Language Pathologist Office: [phone] Pager: [phone]  Shonna Chock 09/20/2019, 1:57 PM

## 2019-09-20 NOTE — TOC Progression Note (Addendum)
Transition of Care [facility]) - Progression Note    Patient Details  Name: Mary Chavez MRN: [id number] Date of Birth: 13-Sep-1928  Transition of Care [facility]) CM/SW [place], LCSW Phone Number: 09/20/2019, 2:04 PM  Clinical Narrative:    2pm-CSW updated patient's daughter in law that we are waiting on the results from the [facility] Peer to Peer. She expressed concern about not being able to bring the patient home. CSW explained that if [facility] continues to uphold the denial, then patient would be out of pocket for placement. She reports they would not be able to afford that and they are going out of town. CSW explained that they would need to apply for long term care Medicaid, which would have to occur from home as patient will not be able to remain in the hospital. CSW following up with [employer].   3pm-CSW spoke with [employer] expedited appeals. Representative stated they did not have a record of the denial yet, but went ahead and completed appeal information. CSW updated patient's daughter.   Expected Discharge Plan: Skilled Nursing Facility Barriers to Discharge: Insurance Authorization  Expected Discharge Plan and Services Expected Discharge Plan: [place] In-house Referral: Clinical Social Work Discharge Planning Services: CM Consult Post Acute Care Choice: [place] Living arrangements for the past 2 months: Apartment                   DME Agency: (owns walker , W/C)         [place] Agency: NA         Social Determinants of Health (SDOH) Interventions    Readmission Risk Interventions No flowsheet data found.

## 2019-09-20 NOTE — Progress Notes (Signed)
Family Medicine Teaching Service Daily Progress Note Intern Pager: [phone]  Patient name: Mary Chavez Medical record number: [id number] Date of birth: Apr 13, 1928 Age: 85 y.o. Gender: female  Primary Care Provider: Allayne Stack, DO  Consultants: SLP,Psychiatry (s/o) Code Status: Full Code   Pt Overview and Major Events to Date: 10/12: Patient admitted with UTI, questionable pneumonia, altered mental status, hallucinations,ceftriaxone and azithromycin started 10/13: Patient treated with ceftriaxone for UTI, continued confusion 10/14: Fever, new retrocardiac chest x-ray finding, broadened to vancomycin,ceftriaxone continuedfor UTI 10/15: Vanc day 3, CTX day 4, Day 2 flagyll(d/c), improved mentation but then regressed mentation  10/16- transitioned to PO abx for PNA, discontinued vanc and CTX, MBSS with dysphagia 2 diet 10/18- completed 7 day course of abx for PNA  10/19- [employer] denied SNF, Peer to Peer offered, repeat COVID negative   Assessment and Plan: Mary Riccardi Allenis a 85 y.o.femalepresenting with altered mental status and hallucinations. PMH is significant forhypertension, GERD, hyperlipidemia, urinary incontinence, history of stroke, and poor appetite.  Altered mental status, hallucinations: Stable Patient is oriented to self and location but unable to tell the year or state today. She states that she feels better today but continues to have soreness in her neck. Patient continued to be afebrile overnight. Most recent blood and urine cultures remain neg to date.  -patient remains on dysphagia 2 diet -scheduled Tylenol for neck pain  -Risperdal 0.25mg  PRN listed  -patient pending SNF placement upon discharge,pending peer to peer for insurance authorization, repeat COVID negative  UTI, resolved Patient denies suprapubic pain today. Urine cultures no growth, WBC this a.m. 11.7 WNL.  Has remained afebrile overnight. -S/p ceftriaxone (10/12-10/16) -Cefdinir  completed, total 7 day abx course   PNA, likely aspiration pneumonia, resolved S/p azithromycin, ceftriaxone, vancomycin and cefdinir.  Remains afebrile overnight.  Breathing comfortably on room air.  WBC this a.m.11.7. -S/p azithromycin, s/p ceftriaxone, s/p vancomycin -Cefdinir completed for total 7 days course of abx -Repeat blood cultures on 10/14: with no growth to date  -monitor respiratory status   Hypokalemia: intermittent K 3.2 as if 10/20. Will continue to monitor  - monitor BMP -replenished with   AKI: resolved Cr 0.91   -discontinue maintenance IV fluids -Daily BMP  Atrial fibrillation Spoke with patient's family regarding history of atrial fibrillation and was confirmed that this is a chronic issue, family was not aware of any prior anticoagulation. CHADVASC score of 6. -continue coreg 3.125 for rate control of AF  -Continue eliquis 5mg  BID   Hypertension: stable on antihypertensive medications BP this a.m134-145/43-47 overnight, 134/47 most recently.  Patient home medications include lisinopril-hydrochlorothiazide 20-25 mg twice daily. Cr. 0.91. -Continue to monitor blood pressure with vitals -Continue Coreg 3.125 twice daily -continue HCTZ at 25mg  QD and monitoring K, to replenish as necessary  -We will plan to have patient restarted on ACEi and diuretic combination pill upon discharge  Left Ankle Pain, resolved Left ankle x-ray showed no acute bony abnormality.  Patient denies any ankle pain today.  -continue to monitor for pain  -Tylenol PRN pain  Patient with QTC prolongation  EKG on 10/14 with QTC of 460, although patient noted to be in A. fib at that time, therefore QTC likely not accurate. -Holding QTC prolonging medications -Patient on cardiac monitoring -shift summaries show 1 deg AV block with PR of 0.23  History of a fall Patient is reported to have had a fall while living in and has residual right lower extremity  weakness -Patient on  fall precautions -PT/OT recommending SNF upon discharge  GERD Home medications include Protonix 20 and Pepcid 20.   -holding home Protonix -continue Pepcid 20 mg daily.  FEN/GI:Dysphagia 2 diet [id number] 5 mg twice daily  Disposition:patient stable for discharge to home with [facility] PT/OT, RN and aid for ADL assistance; will discuss SNF coverage denial with family and patient's stability to   Subjective:  Patient reports that she is feeling well this morning.  No complaints.  Objective: Temp:  [98.3 F (36.8 C)-99.7 F (37.6 C)] 99.7 F (37.6 C) (10/20 0831) Pulse Rate:  [65-74] 70 (10/20 0831) Resp:  [17-19] 18 (10/20 0831) BP: (134-155)/(43-112) 155/59 (10/20 0831) SpO2:  [98 %-100 %] 100 % (10/20 0831)  Physical Exam: General: elderly, frail appearing female with head turned to her left, patient demonstrates ability to rotate head towards right side slowly Neck: neck exam continues to have no overt signs of trauma, muscles do not feel tight upon palpation, patient reports some tenderness with palpation, area is not warm or edematous   Cardio: rate sounds regular today, no murmurs or gallops appreciated  Lungs: lungs are CTAB without wheezing or crackles bilaterally, appears to be aerating well and stable on RA  Abdomen: patient appears to have no tenderness upon palpation  Extremities: no lower extremity edema  Laboratory: Recent Labs  Lab 09/18/19 0637 09/19/19 0836 09/20/19 0305  WBC 11.9* 10.3 11.7*  HGB 9.2* 10.2* 9.3*  HCT 29.5* 31.2* 28.4*  PLT 198 277 295   Recent Labs  Lab 09/18/19 0637 09/19/19 0836 09/20/19 0305  NA 146* 143 141  K 4.0 3.5 3.2*  CL 113* 110 108  CO2 18* 21* 23  BUN 18 13 15   CREATININE 0.96 0.93 0.91  CALCIUM 8.9 9.2 8.8*  GLUCOSE 88 83 97    Imaging/Diagnostic Tests: No new imaging   Stark Klein, MD 09/20/2019, 11:34 AM PGY-2, [place] Intern pager: [phone], text  pages welcome

## 2019-09-20 NOTE — Progress Notes (Signed)
Dr. Pilar Plate contacted [facility] for physician peer to peer. Expecting call from insurance physician within the next 24 hours.   Rory Percy, DO PGY-3, [place] [facility] 09/20/2019 9:12 AM

## 2019-09-21 LAB — BASIC METABOLIC PANEL
Anion gap: 7 (ref 5–15)
BUN: 11 mg/dL (ref 8–23)
CO2: 24 mmol/L (ref 22–32)
Calcium: 8.8 mg/dL — ABNORMAL LOW (ref 8.9–10.3)
Chloride: 109 mmol/L (ref 98–111)
Creatinine, Ser: 0.84 mg/dL (ref 0.44–1.00)
GFR calc Af Amer: >60 mL/min (ref >60)
GFR calc non Af Amer: >60 mL/min (ref >60)
Glucose, Bld: 109 mg/dL — ABNORMAL HIGH (ref 70–99)
Potassium: 3.1 mmol/L — ABNORMAL LOW (ref 3.5–5.1)
Sodium: 140 mmol/L (ref 135–145)

## 2019-09-21 LAB — CBC
HCT: 30.9 % — ABNORMAL LOW (ref 36.0–46.0)
Hemoglobin: 9.8 g/dL — ABNORMAL LOW (ref 12.0–15.0)
MCH: 26.1 pg (ref 26.0–34.0)
MCHC: 31.7 g/dL (ref 30.0–36.0)
MCV: 82.2 fL (ref 80.0–100.0)
Platelets: 311 10*3/uL (ref 150–400)
RBC: 3.76 MIL/uL — ABNORMAL LOW (ref 3.87–5.11)
RDW: 16.7 % — ABNORMAL HIGH (ref 11.5–15.5)
WBC: 13.3 10*3/uL — ABNORMAL HIGH (ref 4.0–10.5)
nRBC: 0 % (ref 0.0–0.2)

## 2019-09-21 MED ORDER — ACETAMINOPHEN 325 MG PO TABS
650.0000 mg | ORAL_TABLET | Freq: Four times a day (QID) | ORAL | Status: DC | PRN
  Administered 2019-09-21 – 2019-09-23 (×4): 650 mg via ORAL
  Filled 2019-09-21 (×4): qty 2

## 2019-09-21 MED ORDER — POTASSIUM CHLORIDE CRYS ER 20 MEQ PO TBCR
40.0000 meq | EXTENDED_RELEASE_TABLET | ORAL | Status: AC
  Administered 2019-09-21 (×2): 40 meq via ORAL
  Filled 2019-09-21 (×2): qty 2

## 2019-09-21 NOTE — Progress Notes (Signed)
Family Medicine Teaching Service Daily Progress Note Intern Pager: [phone]  Patient name: Mary Chavez Medical record number: [id number] Date of birth: 06/26/1928 Age: 85 y.o. Gender: female  Primary Care Provider: Patriciaann Clan, DO  Consultants: SLP,Psychiatry (s/o) Code Status: Full Code   Pt Overview and Major Events to Date: 10/12: Patient admitted with UTI, questionable pneumonia, altered mental status, hallucinations,ceftriaxone and azithromycin started 10/13: Patient treated with ceftriaxone for UTI, continued confusion 10/14: Fever, new retrocardiac chest x-ray finding, broadened to vancomycin,ceftriaxone continuedfor UTI 10/15: Vanc day 3, CTX day 4, Day 2 flagyll(d/c), improved mentation but then regressed mentation  10/16- transitioned to PO abx for PNA, discontinued vanc and CTX, MBSS with dysphagia 2 diet 10/18- completed 7 day course of abx for PNA  10/19- [employer] denied SNF, Peer to Peer offered, repeat COVID negative 10/20-peer to peer denied, SW starting appeal with facility, family states patient cannot be discharged home this week as no one is available to care for her at home   Assessment and Plan: Mary Chavez a 85 y.o.femalepresenting with altered mental status and hallucinations. PMH is significant forhypertension, GERD, hyperlipidemia, urinary incontinence, history of stroke, and poor appetite.  Altered mental status, hallucinations: Stable, hallucinations resolved Patient is oriented to self, location, city, and state.   She states that she feels better, she is more alert and conversant, less uncomfortable appearing.   Patient continued to be afebrile overnight although WBC has increased to 13 from 11. Most recent blood and urine cultures remain neg to date. No exam findings suggestive of changes to her respiratory status or increased abdominal pain.   -patient remains on dysphagia 2 diet -scheduled Tylenol for neck pain  -patient pending SNF  placement, insurance denied initially and denied after peer to peer, SW filing appeal   UTI, resolved Patient denies suprapubic pain today. Urine cultures no growth, WBC this a.m. 11.7 WNL.  Has remained afebrile overnight. -S/p ceftriaxone (10/12-10/16) -Cefdinir completed, total 7 day abx course   PNA, likely aspiration pneumonia, resolved S/p azithromycin, ceftriaxone, vancomycin and cefdinir.  Remains afebrile overnight.  Breathing comfortably on room air.  WBC this a.m.11.7. -S/p azithromycin, s/p ceftriaxone, s/p vancomycin -Cefdinir completed for total 7 days course of abx -Repeat blood cultures on 10/14: with no growth to date  -monitor respiratory status   Hypokalemia: intermittent K 3.1 as if 10/21. Will continue to monitor  - monitor BMP -replenished with 38mEq to be received 2x today   AKI: resolved Cr 0.8   -Daily BMP  Atrial fibrillation, chronic Patient's family agrees to anticoagulation. CHADVASC score of 6. -continue coreg 3.125 for rate control of AF  -Continue eliquis 5mg  BID   Hypertension: stable on antihypertensive medications BP hypertensive overnight, ranges 145-85 overnight.  Patient home medications include lisinopril-hydrochlorothiazide 20-25 mg twice daily. Cr. 0.8 today. -Continue to monitor blood pressure with vitals -Continue Coreg 3.125 twice daily -continue HCTZ at 25mg  QD and monitoring K, to replenish as necessary  -We will plan to have patient restarted on ACEi and diuretic combination pill   Left Ankle Pain, resolved Left ankle x-ray showed no acute bony abnormality.  Patient denies any ankle pain today.  -continue to monitor for pain  -Tylenol PRN pain  Patient with QTC prolongation  EKG on 10/14 with QTC of 460, although patient noted to be in A. fib at that time, therefore QTC likely not accurate. -Holding QTC prolonging medications -Patient on cardiac monitoring -shift summaries show 1 deg AV block with PR of  0.23  History of  a fall Patient is reported to have had a fall while living in [place] and has residual right lower extremity weakness -Patient on fall precautions -PT/OT recommending SNF upon discharge  GERD Home medications include Protonix 20 and Pepcid 20.   -holding home Protonix -continue Pepcid 20 mg daily.  FEN/GI:Dysphagia 2 diet  OEV:[id number] 5 mg twice daily  Disposition:patient stable for discharge to home with home health vs SNF pending appeal process    Subjective:  Patient reports feeling much better than yesterday. Patient is more alert and conversatio   Objective: Temp:  [97.9 F (36.6 C)-99.7 F (37.6 C)] 98.5 F (36.9 C) (10/21 0344) Pulse Rate:  [66-86] 69 (10/21 0344) Resp:  [17-18] 18 (10/21 0344) BP: (145-185)/(59-85) 153/80 (10/21 0344) SpO2:  [98 %-100 %] 99 % (10/21 0344)  Physical Exam: General: elderly female sitting up in bed in NAD, rotating neck with normal ROM  Neck: no edema or enlarged LN  Cardio: heart rate appears to be WNL today, no murmurs appreciated  Lungs: CTAB without wheezing or crackles  Abdomen: soft, NT, no masses appreciated on palpation  Extremities: no pitting edema in bilateral lower extremities   Laboratory: Recent Labs  Lab 09/19/19 0836 09/20/19 0305 09/21/19 0254  WBC 10.3 11.7* 13.3*  HGB 10.2* 9.3* 9.8*  HCT 31.2* 28.4* 30.9*  PLT 277 295 311   Recent Labs  Lab 09/19/19 0836 09/20/19 0305 09/21/19 0254  NA 143 141 140  K 3.5 3.2* 3.1*  CL 110 108 109  CO2 21* 23 24  BUN 13 15 11   CREATININE 0.93 0.91 0.84  CALCIUM 9.2 8.8* 8.8*  GLUCOSE 83 97 109*    Imaging/Diagnostic Tests: No new imaging   , MD 09/21/2019, 6:04 AM PGY-2, [place] Family [facility] FPTS Intern pager: [phone], text pages welcome

## 2019-09-21 NOTE — Progress Notes (Signed)
Physical Therapy Treatment Patient Details Name: Mary Chavez MRN: [id number] DOB: 1928/08/18 Today's Date: 09/21/2019    History of Present Illness Pt is an 85 y/o female admitted secondary to AMS and hallucinations. Workup pending. MRI negative for acute abnormality, however, did showe chronic encephalomacia. PMH includes HTN, CVA, and a fib.     PT Comments    Pt performed attempted sit to stand transfer in [place] stedy.  Pt is slow and guarded with complaints of pain primarily in B feet.  She was able to hold stedy cross bar but unable to pull to move her body to initiate standing.  Attempted to use bed pad and +2 external assistance to achieve standing.  Pt is too weak.  Opted for squat pivot to drop arm recliner.  Pt presents with L lateral lean sitting in recliner.  Placed food in front of her and appears unable to visually locate food on plate and stabbing fork on her tray into nothing.  Will inform OT to check her vision next session.  Continue to recommend SNF placement at this time based on her functional state.     Follow Up Recommendations  SNF;Supervision/Assistance - 24 hour     Equipment Recommendations  (TBD)    Recommendations for Other Services       Precautions / Restrictions Precautions Precautions: Fall Precaution Comments: Hallucinations Restrictions Weight Bearing Restrictions: No    Mobility  Bed Mobility Overal bed mobility: Needs Assistance Bed Mobility: Supine to Sit     Supine to sit: Total assist;+2 for safety/equipment     General bed mobility comments: Pt continues to require +2 total assistance to achieve sitting edge of bed.  She presents with lateral lean to the L.  Transfers Overall transfer level: Needs assistance Equipment used: None Transfers: Set designer Transfers;Sit to/from Stand Sit to Stand: Total assist;+2 physical assistance(able to clear buttock but standing not achieveable.)   Squat pivot transfers: Total assist;+2  safety/equipment     General transfer comment: Pt remains to weak and painful to stand.  Attempted with sara stedy but depsite best efforts of patient and assist from RN and PTA she remains unable to standd.  Pt needed squat pivot to move from bed to recliner.  L arm dropped on chair due to decreased hip clearance.  Ambulation/Gait Ambulation/Gait assistance: (NT remains unable to achieve standing.)               Stairs             Wheelchair Mobility    Modified Rankin (Stroke Patients Only)       Balance Overall balance assessment: Needs assistance Sitting-balance support: No upper extremity supported;Feet supported Sitting balance-Leahy Scale: Poor Sitting balance - Comments: Pt intermittently able to hold her balance for 30-60 sec.  She did presents with lateral LOB and required min-mod to correct.   Standing balance support: Bilateral upper extremity supported;During functional activity Standing balance-Leahy Scale: Zero Standing balance comment: only able to squat pivot no ability to achieve full standing.                            Cognition Arousal/Alertness: Lethargic Behavior During Therapy: Anxious Overall Cognitive Status: Impaired/Different from baseline                         Following Commands: Follows one step commands inconsistently Safety/Judgement: Decreased awareness of safety;Decreased awareness of deficits  Problem Solving: Slow processing;Decreased initiation;Difficulty sequencing;Requires verbal cues;Requires tactile cues General Comments: Not formally assessed as pt in so much pain.      Exercises      General Comments        Pertinent Vitals/Pain Pain Assessment: Faces Faces Pain Scale: Hurts whole lot Pain Location: "I hurt everywhere" Pt seemed the most painful in B feet. Pain Descriptors / Indicators: Grimacing;Moaning;Tender;Sore Pain Intervention(s): Monitored during session;Repositioned    Home  Living                      Prior Function            PT Goals (current goals can now be found in the care plan section) Acute Rehab PT Goals Patient Stated Goal: Unstated PT Goal Formulation: Patient unable to participate in goal setting Potential to Achieve Goals: Fair Progress towards PT goals: Progressing toward goals    Frequency    Min 2X/week      PT Plan Current plan remains appropriate    Co-evaluation              AM-PAC PT "6 Clicks" Mobility   Outcome Measure  Help needed turning from your back to your side while in a flat bed without using bedrails?: Total Help needed moving from lying on your back to sitting on the side of a flat bed without using bedrails?: Total Help needed moving to and from a bed to a chair (including a wheelchair)?: Total Help needed standing up from a chair using your arms (e.g., wheelchair or bedside chair)?: Total Help needed to walk in hospital room?: Total Help needed climbing 3-5 steps with a railing? : Total 6 Click Score: 6    End of Session Equipment Utilized During Treatment: Gait belt Activity Tolerance: Treatment limited secondary to agitation Patient left: with call bell/phone within reach;in chair;with chair alarm set Nurse Communication: Mobility status(informed nursing staff to use maximove for back to bed.) PT Visit Diagnosis: Other abnormalities of gait and mobility (R26.89);Unsteadiness on feet (R26.81);Muscle weakness (generalized) (M62.81)     Time: 1657-9038 PT Time Calculation (min) (ACUTE ONLY): 29 min  Charges:  $Therapeutic Activity: 23-37 mins                     Joycelyn Rua, PTA Acute Rehabilitation Services Pager [phone] Office [phone]     Mary Chavez Artis Delay 09/21/2019, 1:10 PM

## 2019-09-21 NOTE — Progress Notes (Signed)
Patient has temperature 100.1 and complaining pain in legs.Dr.Walsh notified.Order received for tylenol.Will continue to monitor patient.

## 2019-09-21 NOTE — TOC Progression Note (Signed)
Transition of Care [facility]) - Progression Note    Patient Details  Name: Mary Chavez MRN: [id number] Date of Birth: 1927-12-10  Transition of Care [facility]) CM/SW [place], LCSW Phone Number: 09/21/2019, 12:56 PM  Clinical Narrative:    CSW received call from [facility] stating that case had been accepted for expedited appeal and they have 72 hours to review the appeal.    Expected Discharge Plan: Skilled Nursing Facility Barriers to Discharge: Insurance Authorization  Expected Discharge Plan and Services Expected Discharge Plan: [place] In-house Referral: Clinical Social Work Discharge Planning Services: CM Consult Post Acute Care Choice: [place] Living arrangements for the past 2 months: Apartment                   DME Agency: (owns walker , W/C)         [place] Agency: NA         Social Determinants of Health (SDOH) Interventions    Readmission Risk Interventions No flowsheet data found.

## 2019-09-22 LAB — BASIC METABOLIC PANEL
Anion gap: 10 (ref 5–15)
BUN: 14 mg/dL (ref 8–23)
CO2: 23 mmol/L (ref 22–32)
Calcium: 8.6 mg/dL — ABNORMAL LOW (ref 8.9–10.3)
Chloride: 107 mmol/L (ref 98–111)
Creatinine, Ser: 0.89 mg/dL (ref 0.44–1.00)
GFR calc Af Amer: >60 mL/min (ref >60)
GFR calc non Af Amer: 57 mL/min — ABNORMAL LOW (ref >60)
Glucose, Bld: 99 mg/dL (ref 70–99)
Potassium: 3.5 mmol/L (ref 3.5–5.1)
Sodium: 140 mmol/L (ref 135–145)

## 2019-09-22 LAB — CBC WITH DIFFERENTIAL/PLATELET
Abs Immature Granulocytes: 0.08 10*3/uL — ABNORMAL HIGH (ref 0.00–0.07)
Basophils Absolute: 0.1 10*3/uL (ref 0.0–0.1)
Basophils Relative: 1 %
Eosinophils Absolute: 0.2 10*3/uL (ref 0.0–0.5)
Eosinophils Relative: 2 %
HCT: 28.7 % — ABNORMAL LOW (ref 36.0–46.0)
Hemoglobin: 9 g/dL — ABNORMAL LOW (ref 12.0–15.0)
Immature Granulocytes: 1 %
Lymphocytes Relative: 20 %
Lymphs Abs: 2.5 10*3/uL (ref 0.7–4.0)
MCH: 25.9 pg — ABNORMAL LOW (ref 26.0–34.0)
MCHC: 31.4 g/dL (ref 30.0–36.0)
MCV: 82.5 fL (ref 80.0–100.0)
Monocytes Absolute: 1.9 10*3/uL — ABNORMAL HIGH (ref 0.1–1.0)
Monocytes Relative: 15 %
Neutro Abs: 8 10*3/uL — ABNORMAL HIGH (ref 1.7–7.7)
Neutrophils Relative %: 61 %
Platelets: 322 10*3/uL (ref 150–400)
RBC: 3.48 MIL/uL — ABNORMAL LOW (ref 3.87–5.11)
RDW: 16.6 % — ABNORMAL HIGH (ref 11.5–15.5)
WBC: 12.8 10*3/uL — ABNORMAL HIGH (ref 4.0–10.5)
nRBC: 0 % (ref 0.0–0.2)

## 2019-09-22 NOTE — TOC Progression Note (Signed)
Transition of Care [facility]) - Progression Note    Patient Details  Name: Mary Chavez MRN: [id number] Date of Birth: [birth year]  Transition of Care [facility]) CM/SW [place], LCSW Phone Number: 09/22/2019, 4:21 PM  Clinical Narrative:    SNF still awaiting appeal decision from [place].    Expected Discharge Plan: Skilled Nursing Facility Barriers to Discharge: Insurance Authorization  Expected Discharge Plan and Services Expected Discharge Plan: [name] In-house Referral: Clinical Social Work Discharge Planning Services: CM Consult Post Acute Care Choice: [place] Living arrangements for the past 2 months: Apartment                   DME Agency: (owns walker , W/C)         [place] Agency: NA         Social Determinants of Health (SDOH) Interventions    Readmission Risk Interventions No flowsheet data found.

## 2019-09-22 NOTE — Progress Notes (Addendum)
Family Medicine Teaching Service Daily Progress Note Intern Pager: [phone]  Patient name: Mary Chavez Medical record number: [id number] Date of birth: November 23, 1928 Age: 85 y.o. Gender: female  Primary Care Provider: Patriciaann Clan, DO  Consultants: SLP,Psychiatry (s/o) Code Status: Full Code   Pt Overview and Major Events to Date: 10/12: Patient admitted with UTI, questionable pneumonia, altered mental status, hallucinations,ceftriaxone and azithromycin started 10/13: Patient treated with ceftriaxone for UTI, continued confusion 10/14: Fever, new retrocardiac chest x-ray finding, broadened to vancomycin,ceftriaxone continuedfor UTI 10/15: Vanc day 3, CTX day 4, Day 2 flagyll(d/c), improved mentation but then regressed mentation  10/16- transitioned to PO abx for PNA, discontinued vanc and CTX, MBSS with dysphagia 2 diet 10/18- completed 7 day course of abx for PNA  10/19- [employer] denied SNF, Peer to Peer offered, repeat COVID negative 10/20-peer to peer denied, SW starting appeal with facility, family states patient cannot be discharged home this week as no one is available to care for her at home  10/21- SNF appeal in review for up to 72 hours    Assessment and Plan: Mary Aguon Allenis a 85 y.o.femalepresenting with altered mental status and hallucinations. PMH is significant forhypertension, GERD, hyperlipidemia, urinary incontinence, history of stroke, and poor appetite.  Altered mental status, hallucinations: Stable, hallucinations resolved Patient states that she is doing well and feeling better than previous exam.  Patient continues to be oriented to not oriented to name or location year or situation.  Patient is calm and sleeping in bed.  Blood and urine cultures remain neg to date.  -patient remains on dysphagia 2 diet -scheduled Tylenol for neck pain  -discontinued cardiac monitoring  -patient pending SNF placement, SW filing appeal, follow-up for baseline  placement  UTI, resolved Patient continues to deny suprapubic pain.  Has remained afebrile overnight although note a elevated temperature of 100.1 since come down to 97.7. -S/p ceftriaxone (10/12-10/16) -Cefdinir completed, total 7 day abx course   PNA, likely aspiration pneumonia, resolved S/p azithromycin, ceftriaxone, vancomycin and cefdinir.  Remains afebrile overnight.  Breathing comfortably on room air with no crackles or wheezing Pulmonary auscultation.  -S/p azithromycin, s/p ceftriaxone, s/p vancomycin -Cefdinir completed for total 7 days course of abx -Repeat blood cultures on 10/14: with no growth to date  -monitor respiratory status   Hypokalemia: resolved K 3.5 as if 10/22. Will continue to monitor  - monitor BMP  AKI: resolved Cr stable at 0.8   -Daily BMP  Atrial fibrillation, chronic Patient anticoagulated. CHADVASC score of 6. -continue coreg 3.125 for rate control of AF  -Continue eliquis 5mg  BID   Hypertension: stable on antihypertensive medications BP hypertensive overnight.  Patient home medications include lisinopril-hydrochlorothiazide 20-25 mg twice daily. Cr. 0.8 today. Pulse pressure within normal range for patient's age, will not add further agents at this time. -Continue to monitor blood pressure with vitals -Continue Coreg 3.125 twice daily -continue HCTZ at 25mg  QD and monitoring K, to replenish as necessary  -We will plan to have patient restarted on ACEi and diuretic combination pill upon discharge   Left Ankle Pain, resolved Left ankle x-ray showed no acute bony abnormality.  Patient denies any ankle pain today.  -continue to monitor for pain  -Tylenol PRN pain  Patient with QTC prolongation  EKG on 10/14 with QTC of 460, although patient noted to be in A. fib at that time, therefore QTC likely not accurate. -We will continue to monitor -Patient on cardiac monitoring  History of a fall Patient is  reported to have had a fall while  living in [place] and has residual right lower extremity weakness.  Patient complained of leg pain overnight, given Tylenol.  Patient has no complaints this morning -Patient on fall precautions -PT/OT recommending SNF upon discharge  GERD Home medications include Protonix 20 and Pepcid 20.   -holding home Protonix -continue Pepcid 20 mg daily.  FEN/GI:Dysphagia 2 diet  NWG:NFAOZHY 5 mg twice daily  Disposition:patient stable for discharge to home with home health vs SNF pending appeal process result  Subjective:  Patient reports feeling well this morning with no complaints of pain, difficulty breathing or suprapubic tenderness.  Objective: Temp:  [97.7 F (36.5 C)-100.1 F (37.8 C)] 97.7 F (36.5 C) (10/22 0757) Pulse Rate:  [69-88] 79 (10/22 0757) Resp:  [17-18] 18 (10/22 0757) BP: (139-164)/(61-79) 160/65 (10/22 0757) SpO2:  [96 %-100 %] 97 % (10/22 0757)  Physical Exam: General: Neck: Elderly female sleeping in bed comfortably no acute distress Cardio: Regular rate and rhythm without murmurs gallops or friction rub Lungs: Lung fields are clear to auscultation bilaterally, no wheezing, no crackles, patient stable on room air Abdomen: Abdomen is soft, no tenderness to palpation, bowel sounds appreciated Extremities: Lateral lower extremities without edema  Laboratory: Recent Labs  Lab 09/19/19 0836 09/20/19 0305 09/21/19 0254  WBC 10.3 11.7* 13.3*  HGB 10.2* 9.3* 9.8*  HCT 31.2* 28.4* 30.9*  PLT 277 295 311   Recent Labs  Lab 09/20/19 0305 09/21/19 0254 09/22/19 0347  NA 141 140 140  K 3.2* 3.1* 3.5  CL 108 109 107  CO2 23 24 23   BUN 15 11 14   CREATININE 0.91 0.84 0.89  CALCIUM 8.8* 8.8* 8.6*  GLUCOSE 97 109* 99    Imaging/Diagnostic Tests: No new imaging   , MD 09/22/2019, 9:19 AM PGY-2, [place] [facility] FPTS Intern pager: [phone], text pages welcome

## 2019-09-22 NOTE — Progress Notes (Signed)
Occupational Therapy Treatment Patient Details Name: Mary Chavez MRN: [id number] DOB: 25-Feb-1928 Today's Date: 09/22/2019    History of present illness Pt is an 85 y/o female admitted secondary to AMS and hallucinations. Workup pending. MRI negative for acute abnormality, however, did showe chronic encephalomacia. PMH includes HTN, CVA, and a fib.    OT comments  Pt very lethargic and self-limiting this session. Pt minimally engaged and requiring total A for all mobility and participation in ADLs this session 2/2 cognitive deficits and slow processing. SNF remains appropriate d/c plan- pt is a very high burden of care for family and is unable to return home.    Follow Up Recommendations  SNF;Supervision/Assistance - 24 hour    Equipment Recommendations  None recommended by OT    Recommendations for Other Services PT consult    Precautions / Restrictions Precautions Precautions: Fall Precaution Comments: Hallucinations Restrictions Weight Bearing Restrictions: No       Mobility Bed Mobility Overal bed mobility: Needs Assistance Bed Mobility: Supine to Sit Rolling: Total assist;+2 for physical assistance   Supine to sit: Total assist;+2 for safety/equipment     General bed mobility comments: Pt continues to require +2 total assistance to achieve sitting edge of bed.  She presents with lateral lean to the L.  Transfers                      Balance Overall balance assessment: Needs assistance Sitting-balance support: No upper extremity supported;Feet supported Sitting balance-Leahy Scale: Poor Sitting balance - Comments: Pt intermittently maintaining balance, but overall very poor sitting balance Postural control: Left lateral lean                                 ADL either performed or assessed with clinical judgement   ADL Overall ADL's : Needs assistance/impaired Eating/Feeding: Maximal assistance;Bed level   Grooming: Oral  care;Moderate assistance   Upper Body Bathing: Maximal assistance;Bed level   Lower Body Bathing: Maximal assistance;+2 for physical assistance   Upper Body Dressing : Maximal assistance;Bed level   Lower Body Dressing: Maximal assistance;+2 for physical assistance;Bed level   Toilet Transfer: +2 for physical assistance;Total assistance;+2 for safety/equipment             General ADL Comments: No transfer attempted d/t cognitive status and poor sitting balance     Vision   Additional Comments: Unable to assess 2/2 pt not following commands accurately with pain/cognition          Cognition Arousal/Alertness: Lethargic Behavior During Therapy: Anxious Overall Cognitive Status: Impaired/Different from baseline Area of Impairment: Orientation;Attention;Memory;Following commands;Safety/judgement;Awareness;Problem solving                 Orientation Level: Disoriented to;Place;Time;Situation Current Attention Level: Focused Memory: Decreased short-term memory Following Commands: Follows one step commands inconsistently Safety/Judgement: Decreased awareness of safety;Decreased awareness of deficits Awareness: Intellectual Problem Solving: Slow processing;Decreased initiation;Difficulty sequencing;Requires verbal cues;Requires tactile cues General Comments: Unable to fully assess, as pt groaning in pain with movement and very lethargic        Exercises     Shoulder Instructions       General Comments Did not advance past EOB 2/2 poor pt participation    Pertinent Vitals/ Pain       Pain Assessment: Faces Faces Pain Scale: Hurts even more Pain Location: L leg Pain Descriptors / Indicators: Grimacing;Moaning;Tender;Sore Pain Intervention(s): Monitored during session;Repositioned;Limited activity within  patient's tolerance         Frequency  Min 2X/week        Progress Toward Goals  OT Goals(current goals can now be found in the care plan section)   Progress towards OT goals: OT to reassess next treatment;Not progressing toward goals - comment  Acute Rehab OT Goals Patient Stated Goal: Unstated OT Goal Formulation: Patient unable to participate in goal setting Time For Goal Achievement: 09/27/19 Potential to Achieve Goals: Poor  Plan Discharge plan remains appropriate       AM-PAC OT "6 Clicks" Daily Activity     Outcome Measure   Help from another person eating meals?: Total Help from another person taking care of personal grooming?: Total Help from another person toileting, which includes using toliet, bedpan, or urinal?: Total Help from another person bathing (including washing, rinsing, drying)?: Total Help from another person to put on and taking off regular upper body clothing?: Total Help from another person to put on and taking off regular lower body clothing?: Total 6 Click Score: 6    End of Session    OT Visit Diagnosis: Unsteadiness on feet (R26.81);Other abnormalities of gait and mobility (R26.89);Muscle weakness (generalized) (M62.81);Other symptoms and signs involving cognitive function   Activity Tolerance Treatment limited secondary to agitation   Patient Left in bed;with call bell/phone within reach;with bed alarm set   Nurse Communication Mobility status        Time: 1829-9371 OT Time Calculation (min): 25 min  Charges: OT General Charges $OT Visit: 1 Visit OT Treatments $Self Care/Home Management : 23-37 mins   Curtis Sites OTR/L  09/22/2019, 1:55 PM

## 2019-09-22 NOTE — Progress Notes (Signed)
  Speech Language Pathology Treatment: Dysphagia  Patient Details Name: Mary Chavez MRN: [id number] DOB: January 19, 1928 Today's Date: 09/22/2019 Time: 71-     Assessment / Plan / Recommendation Clinical Impression  Pt seen at bedside for assessment of diet tolerance and family education. Pt's daughter in law was present during this session, and was educated regarding results of MBS and safe swallow precautions. Pt has demonstrated tolerance of current diet, however, her intake has been poor (even at baseline per family). Lungs are CTA per MD, and pt is afebrile. Will sign off at this time. Please reconsult if needs arise.    HPI HPI: Pt is a 85 Y/O F with PMX of  HTN, HLD, CVA brought in by the family for change in mental status and Hallucination, which started a few hours before presentation. Denies LOC, no fall, no facial asymmetry, no limb weakness.  Head CT and Brain MRI were clear for acute abnormalities, but revealed "chronic encephalomalacia" and "generalized brain atrophy".  CXR on 10/12 reported: "Findings suspicious for right middle lobe consolidation, possibly pneumonia".  No documented dysphagia hx.       SLP Plan  Discharge SLP treatment due to goals met, education completed.       Recommendations  Diet recommendations: Dysphagia 2 (fine chop);Thin liquid;Dysphagia 1 (puree) Liquids provided via: Cup Medication Administration: Whole meds with liquid Supervision: Full supervision/cueing for compensatory strategies;Staff to assist with self feeding Compensations: Minimize environmental distractions;Slow rate;Small sips/bites Postural Changes and/or Swallow Maneuvers: Seated upright 90 degrees;Upright 30-60 min after meal                Oral Care Recommendations: Oral care QID;Staff/trained caregiver to provide oral care Follow up Recommendations: 24 hour supervision/assistance;Skilled Nursing facility SLP Visit Diagnosis: Dysphagia, oropharyngeal phase (R13.12) Plan:  Discharge SLP treatment due to (comment)       GO               Mary Chavez B. Mary Chavez, [facility], [place] Speech Language Pathologist Office: [phone] Pager: [phone]  Mary Chavez 09/22/2019, 12:20 PM

## 2019-09-22 NOTE — TOC Progression Note (Signed)
Transition of Care [facility]) - Progression Note    Patient Details  Name: Mary Chavez MRN: [id number] Date of Birth: 11-16-1928  Transition of Care [facility]) CM/SW [place], LCSW Phone Number: 09/22/2019, 6:05 PM  Clinical Narrative:    CSW received call from [facility] stating they have overturned the denial and will approve for patient to go to [facility] tomorrow. CSW updated Bryson Ha from [place] and they do not require an updated COVID test for patient since we have been waiting on insurance.     Expected Discharge Plan: Skilled Nursing Facility Barriers to Discharge: Insurance Authorization  Expected Discharge Plan and Services Expected Discharge Plan: [place] In-house Referral: Clinical Social Work Discharge Planning Services: CM Consult Post Acute Care Choice: [place] Living arrangements for the past 2 months: Apartment                   DME Agency: (owns walker , W/C)         [place] Agency: NA         Social Determinants of Health (SDOH) Interventions    Readmission Risk Interventions No flowsheet data found.

## 2019-09-23 DIAGNOSIS — R64 Cachexia: Secondary | ICD-10-CM

## 2019-09-23 LAB — BASIC METABOLIC PANEL
Anion gap: 9 (ref 5–15)
BUN: 16 mg/dL (ref 8–23)
CO2: 24 mmol/L (ref 22–32)
Calcium: 8.6 mg/dL — ABNORMAL LOW (ref 8.9–10.3)
Chloride: 106 mmol/L (ref 98–111)
Creatinine, Ser: 0.89 mg/dL (ref 0.44–1.00)
GFR calc Af Amer: >60 mL/min (ref >60)
GFR calc non Af Amer: 57 mL/min — ABNORMAL LOW (ref >60)
Glucose, Bld: 123 mg/dL — ABNORMAL HIGH (ref 70–99)
Potassium: 3.2 mmol/L — ABNORMAL LOW (ref 3.5–5.1)
Sodium: 139 mmol/L (ref 135–145)

## 2019-09-23 LAB — CBC
HCT: 27.2 % — ABNORMAL LOW (ref 36.0–46.0)
Hemoglobin: 8.9 g/dL — ABNORMAL LOW (ref 12.0–15.0)
MCH: 26.8 pg (ref 26.0–34.0)
MCHC: 32.7 g/dL (ref 30.0–36.0)
MCV: 81.9 fL (ref 80.0–100.0)
Platelets: 272 10*3/uL (ref 150–400)
RBC: 3.32 MIL/uL — ABNORMAL LOW (ref 3.87–5.11)
RDW: 16.1 % — ABNORMAL HIGH (ref 11.5–15.5)
WBC: 9.8 10*3/uL (ref 4.0–10.5)
nRBC: 0 % (ref 0.0–0.2)

## 2019-09-23 LAB — URIC ACID: Uric Acid, Serum: 9 mg/dL — ABNORMAL HIGH (ref 2.5–7.1)

## 2019-09-23 MED ORDER — COLCHICINE 0.6 MG PO TABS
0.6000 mg | ORAL_TABLET | Freq: Once | ORAL | Status: AC
  Administered 2019-09-23: 10:00:00 0.6 mg via ORAL
  Filled 2019-09-23: qty 1

## 2019-09-23 MED ORDER — CARVEDILOL 3.125 MG PO TABS: 3.1250 mg | ORAL_TABLET | Freq: Two times a day (BID) | ORAL | 0 refills | Status: AC

## 2019-09-23 MED ORDER — ACETAMINOPHEN 325 MG PO TABS
650.0000 mg | ORAL_TABLET | Freq: Four times a day (QID) | ORAL | Status: DC
  Administered 2019-09-23: 13:00:00 650 mg via ORAL
  Filled 2019-09-23: qty 2

## 2019-09-23 MED ORDER — APIXABAN 5 MG PO TABS: 5.0000 mg | ORAL_TABLET | Freq: Two times a day (BID) | ORAL | 0 refills | Status: AC

## 2019-09-23 MED ORDER — PREDNISONE 20 MG PO TABS: 20.0000 mg | ORAL_TABLET | Freq: Every day | ORAL | 0 refills | Status: AC

## 2019-09-23 MED ORDER — PREDNISONE 20 MG PO TABS: 20.0000 mg | ORAL_TABLET | Freq: Every day | ORAL | Status: DC

## 2019-09-23 MED ORDER — COLCHICINE 0.6 MG PO TABS
1.2000 mg | ORAL_TABLET | Freq: Once | ORAL | Status: AC
  Administered 2019-09-23: 1.2 mg via ORAL
  Filled 2019-09-23: qty 2

## 2019-09-23 MED ORDER — ACETAMINOPHEN 500 MG PO TABS: 1000.0000 mg | ORAL_TABLET | Freq: Three times a day (TID) | ORAL | 0 refills | Status: AC

## 2019-09-23 NOTE — TOC Transition Note (Signed)
Transition of Care [facility]) - CM/SW Discharge Note   Patient Details  Name: Lurae Hornbrook MRN: [id number] Date of Birth: [birth year]  [facility]) CM/SW Contact:  Benard Halsted, LCSW Phone Number: 09/23/2019, 1:26 PM   Clinical Narrative:    Patient will DC to: [place] Anticipated DC date: 09/23/19 Family notified: Charlett Nose, Daughter in Systems developer by: Corey Harold   Per MD patient ready for DC to [facility]. RN, patient, patient's family, and facility notified of DC. Discharge Summary and FL2 sent to facility. RN to call report prior to discharge ([phone] Rm [phone]). DC packet on chart. Ambulance transport requested for patient.   CSW will sign off for now as social work intervention is no longer needed. Please consult [place] again if new needs arise.  Cedric Fishman, LCSW Clinical Social Worker [phone]    Final next level of care: Skilled Nursing Facility Barriers to Discharge: No Barriers Identified   Patient Goals and CMS Choice Patient states their goals for this hospitalization and ongoing recovery are:: Get stronger CMS Medicare.gov Compare Post Acute Care list provided to:: Patient Represenative (must comment)(Son and daughter in law) Choice offered to / list presented to : Adult Children  Discharge Placement   Existing PASRR number confirmed : 09/23/19          Patient chooses bed at: [place] Patient to be transferred to facility by: [place] Name of family member notified: Charlett Nose Patient and family notified of of transfer: 09/23/19  Discharge Plan and Services In-house Referral: Clinical Social Work Discharge Planning Services: CM Consult Post Acute Care Choice: [place]            DME Agency: (owns walker , W/C)       HH Arranged: NA [place] Agency: NA        Social Determinants of Health (SDOH) Interventions     Readmission Risk Interventions No flowsheet data found.

## 2019-09-23 NOTE — Progress Notes (Addendum)
[facility] Service Daily Progress Note Intern Pager: [phone]  Patient name: Mary Chavez Medical record number: [id number] Date of birth: December 09, 1927 Age: 85 y.o. Gender: female  Primary Care Provider: Patriciaann Clan, DO  Consultants: SLP,Psychiatry (s/o) Code Status: Full Code   Pt Overview and Major Events to Date: 10/12: Patient admitted with UTI, questionable pneumonia, altered mental status, hallucinations,ceftriaxone and azithromycin started 10/13: Patient treated with ceftriaxone for UTI, continued confusion 10/14: Fever, new retrocardiac chest x-ray finding, broadened to vancomycin,ceftriaxone continuedfor UTI 10/15: Vanc day 3, CTX day 4, Day 2 flagyll(d/c), improved mentation but then regressed mentation  10/16- transitioned to PO abx for PNA, discontinued vanc and CTX, MBSS with dysphagia 2 diet 10/18- completed 7 day course of abx for PNA  10/19- [employer] denied SNF, Peer to Peer offered, repeat COVID negative 10/20-peer to peer denied, SW starting appeal with facility, family states patient cannot be discharged home this week as no one is available to care for her at home  10/21- SNF appeal in review for up to 72 hours    Assessment and Plan: Mary Neira Allenis a 85 y.o.femalepresenting with altered mental status and hallucinations. PMH is significant forhypertension, GERD, hyperlipidemia, urinary incontinence, history of stroke, and poor appetite.  Lower extremity pain, possible gout flare Overnight nurse reports significant pain overnight and very little sleep.  She had incredible pain with any movement of her lower extremities.  On exam this morning, she appears to have mild tenderness to palpation lower extremities bilaterally with severe focal tenderness over right first MTP.  She has a history of gout.  She reports this feels similar to previous flares.  We will begin treatment for acute gout flare. -Colchicine 1.2 mg followed by colchicine 0.6 mg  after 1 hour -We will avoid NSAIDs due to age -We will consider p.o. steroids due to severity -Follow-up uric acid, BMP, CBC  Altered mental status, hallucinations: Stable, hallucinations resolved Medical work-up and treatment is complete.  Mary Chavez has been awaiting placement for several days.  According to last social work note, placement is now available I will plan to discharge on 10/23 to a skilled nursing facility. -patient remains on dysphagia 2 diet -scheduled Tylenol for neck pain  -patient pending SNF placement, SW filing appeal, follow-up for baseline placement  UTI, resolved Cefdinir completed, total 7 day abx course  -No further work-up  PNA, likely aspiration pneumonia, resolved  Cefdinir completed for total 7 days course of abx -No further work-up  Hypokalemia: resolved - monitor BMP, replete as needed  AKI: resolved  -Daily BMP  Atrial fibrillation, chronic Patient anticoagulated. CHADVASC score of 6. -continue coreg 3.125 for rate control of AF  -Continue eliquis 5mg  BID   Hypertension: stable on antihypertensive medications BP hypertensive overnight.  Patient home medications include lisinopril-hydrochlorothiazide 20-25 mg twice daily. Cr. 0.8 today. Pulse pressure within normal range for patient's age, will not add further agents at this time. - Coreg 3.125 twice daily - HCTZ at 25mg  QD  - plan to have patient restarted on ACEi and diuretic combination pill upon discharge   Left Ankle Pain, resolved Left ankle x-ray showed no acute bony abnormality.  Patient denies any ankle pain today.  -continue to monitor for pain  -Tylenol PRN pain  History of a fall -Patient on fall precautions -PT/OT recommending SNF upon discharge  GERD Home medications include Protonix 20 and Pepcid 20.   -holding home Protonix -continue Pepcid 20 mg daily.  FEN/GI:Dysphagia 2 diet BLT:[id number]  5 mg twice daily  Disposition:patient stable for discharge to home  with home health vs SNF pending appeal process result  Subjective:  No acute events overnight charted.  RN reports that Mary Chavez was experiencing excruciating pain overnight in her legs bilaterally.  She had much difficulty sleeping.  Per nursing report, her legs seemed tender everywhere to the touch with a very bad pain with manipulation or movement of the patient.  Objective: Temp:  [97.7 F (36.5 C)-98.8 F (37.1 C)] 98.2 F (36.8 C) (10/23 0247) Pulse Rate:  [71-85] 78 (10/23 0247) Resp:  [16-19] 18 (10/23 0247) BP: (120-160)/(47-80) 134/49 (10/23 0247) SpO2:  [97 %-100 %] 100 % (10/23 0247) Weight:  [76.6 kg] 76.6 kg (10/23 0500)  Physical Exam: General: Found asleep with her head resting on the bed rail leaning forward.  She noted significant discomfort in her legs throughout our conversation and exam.  Mild distress from her lower extremity pain.  Oriented to person and place. HEENT: Neck non-tender without lymphadenopathy, masses or thyromegaly Cardio: Normal S1 and S2, no S3 or S4. Rhythm is regular. No murmurs or rubs.  Pulm: Clear to auscultation bilaterally, no crackles, wheezing, or diminished breath sounds. Normal respiratory effort Abdomen: Bowel sounds normal. Abdomen soft and non-tender.  Extremities: No notable lower extremity edema.  No notable swelling of thighs or legs bilaterally.  No cordlike masses palpated.  Increased tenderness over right first MTP. Neuro: Cranial nerves grossly intact  Laboratory: Recent Labs  Lab 09/20/19 0305 09/21/19 0254 09/22/19 0347  WBC 11.7* 13.3* 12.8*  HGB 9.3* 9.8* 9.0*  HCT 28.4* 30.9* 28.7*  PLT 295 311 322   Recent Labs  Lab 09/20/19 0305 09/21/19 0254 09/22/19 0347  NA 141 140 140  K 3.2* 3.1* 3.5  CL 108 109 107  CO2 23 24 23   BUN 15 11 14   CREATININE 0.91 0.84 0.89  CALCIUM 8.8* 8.8* 8.6*  GLUCOSE 97 109* 99    Imaging/Diagnostic Tests: No results found.   , MD 09/23/2019, 6:41  AM Mary Chavez, Mary Chavez FPTS Intern pager: [phone], text pages welcome

## 2019-10-02 DEATH — deceased

## 2019-10-07 ENCOUNTER — Other Ambulatory Visit: Payer: Self-pay

## 2019-10-07 DIAGNOSIS — R630 Anorexia: Secondary | ICD-10-CM

## 2019-10-10 ENCOUNTER — Other Ambulatory Visit: Payer: Self-pay

## 2019-10-10 MED ORDER — MIRTAZAPINE 15 MG PO TABS: 15.0000 mg | ORAL_TABLET | Freq: Every day | ORAL | 0 refills | Status: AC

## 2019-10-20 ENCOUNTER — Ambulatory Visit: Payer: Medicare HMO | Admitting: Physical Therapy

## 2019-10-20 ENCOUNTER — Ambulatory Visit: Payer: Medicare HMO

## 2021-01-19 IMAGING — MR MR HEAD W/O CM
15 of 16 series · 44 of 48 positions shown · non-contrast
Comparison: Head CT 09/12/2019

CLINICAL DATA: Altered mental status and hallucinations.

EXAM:
MRI HEAD WITHOUT CONTRAST
TECHNIQUE: Multiplanar, multiecho pulse sequences of the brain and surrounding
structures were obtained without intravenous contrast.

[Series 5: DWI · axial · 3.0mm · 0.88mm/px · z∈[-110,+36]mm · 6 of 100 slices shown (1 of 6)]
[im 1/100]
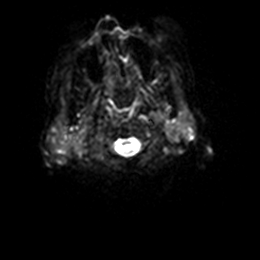
[im 20/100]
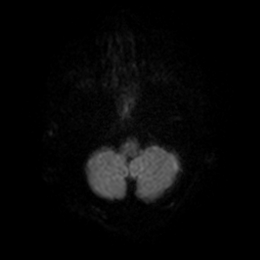
[im 40/100]
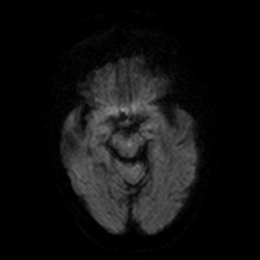
[im 60/100]
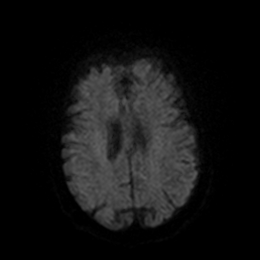
[im 80/100]
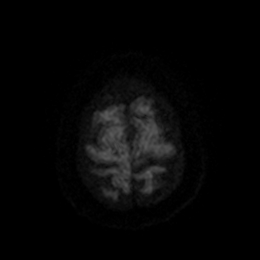
[im 100/100]
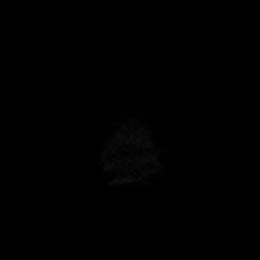

[Series 6: DWI · axial · 3.0mm · 0.88mm/px · z∈[-110,+36]mm · 3 of 50 slices shown (2 of 6)]
[im 1/50]
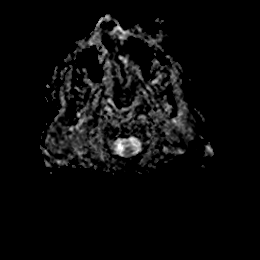
[im 25/50]
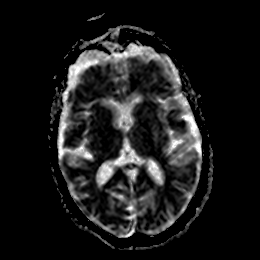
[im 50/50]
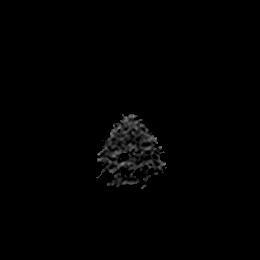

[Series 7: DWI · axial · 3.0mm · 0.88mm/px · z∈[-110,+36]mm · 6 of 100 slices shown (3 of 6)]
[im 1/100]
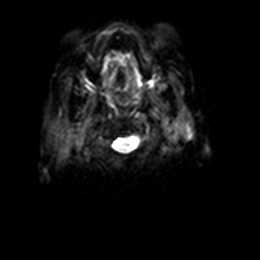
[im 20/100]
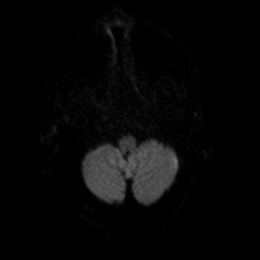
[im 40/100]
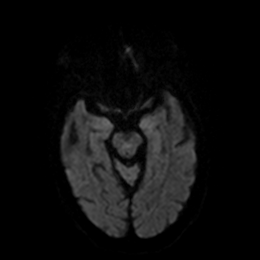
[im 60/100]
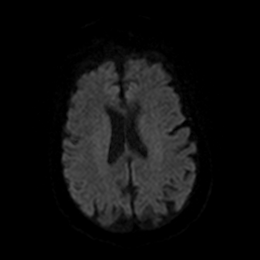
[im 80/100]
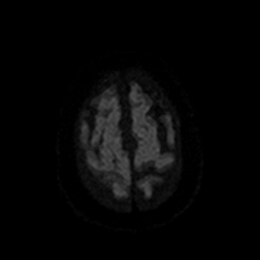
[im 100/100]
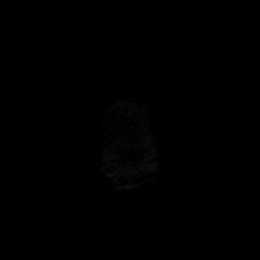

[Series 8: DWI · axial · 3.0mm · 0.88mm/px · z∈[-110,+36]mm · 3 of 50 slices shown (4 of 6)]
[im 1/50]
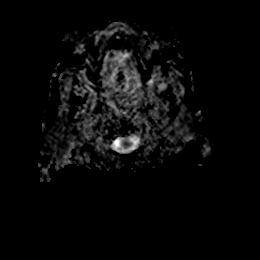
[im 25/50]
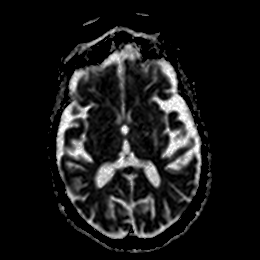
[im 50/50]
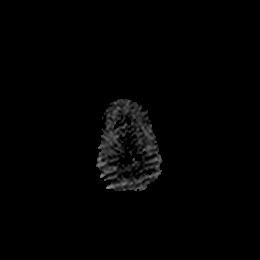

[Series 9: DWI · coronal · 4.0mm · 0.88mm/px · 4 of 70 slices shown (5 of 6)]
[im 1/70]
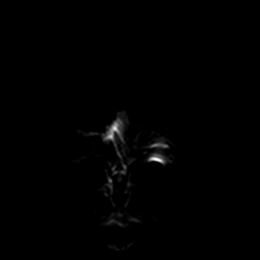
[im 24/70]
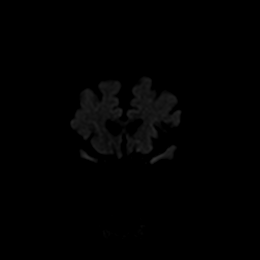
[im 47/70]
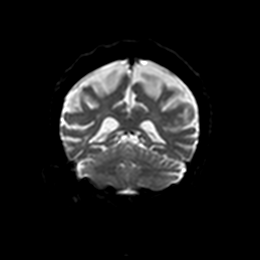
[im 70/70]
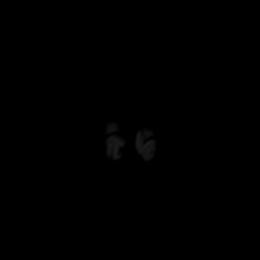

[Series 10: DWI · coronal · 4.0mm · 0.88mm/px · 2 of 35 slices shown (6 of 6)]
[im 1/35]
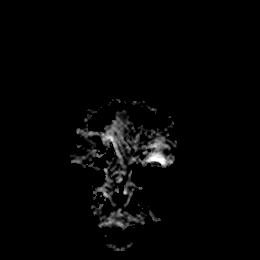
[im 35/35]
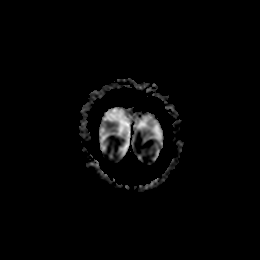

[Series 11: T1 · sagittal · 5.0mm · 0.75mm/px · 1 of 23 slices shown]
[im 1/23]
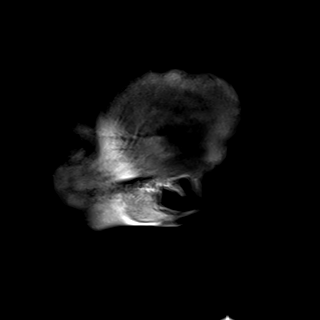

[Series 12: T2 · axial · 5.0mm · 0.72mm/px · 1 of 25 slices shown (1 of 2)]
[im 1/25]
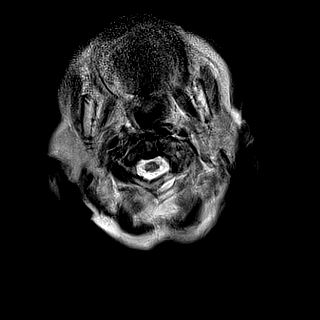

[Series 13: FLAIR · axial · 5.0mm · 0.90mm/px · 1 of 25 slices shown (1 of 2)]
[im 1/25]
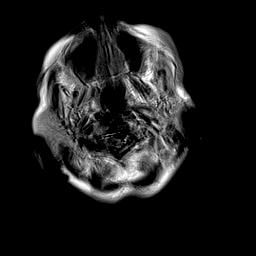

[Series 14: FLAIR · axial · 5.0mm · 0.45mm/px · 1 of 25 slices shown (2 of 2)]
[im 1/25]
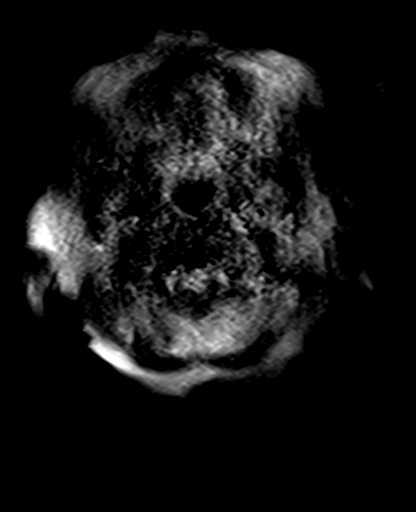

[Series 15: mag_images · axial · 3.0mm · 0.90mm/px · z∈[-123,+53]mm · 4 of 60 slices shown]
[im 1/60]
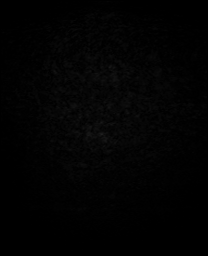
[im 20/60]
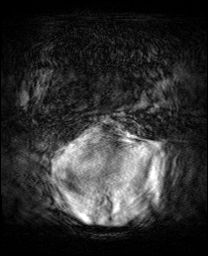
[im 40/60]
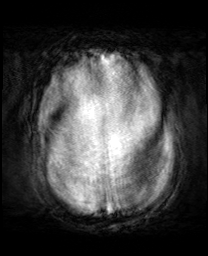
[im 60/60]
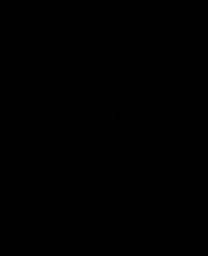

[Series 16: pha_images · axial · 3.0mm · 0.90mm/px · z∈[-96,+32]mm · 3 of 43 slices shown]
[im 1/43]
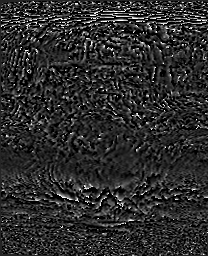
[im 22/43]
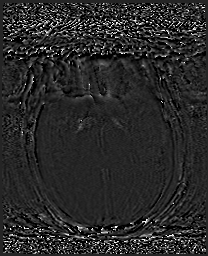
[im 43/43]
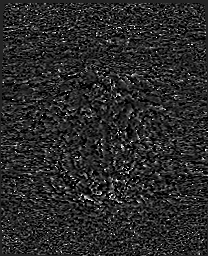

[Series 17: swi_images · axial · 3.0mm · 0.90mm/px · z∈[-123,+53]mm · 4 of 60 slices shown]
[im 1/60]
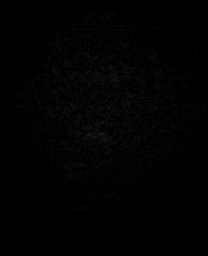
[im 20/60]
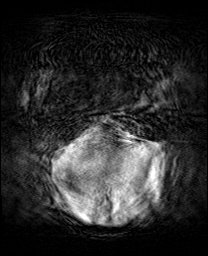
[im 40/60]
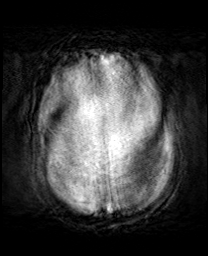
[im 60/60]
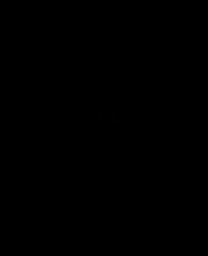

[Series 18: mip_images(sw) · axial · 24.0mm · 0.90mm/px · z∈[-112,+43]mm · 3 of 53 slices shown]
[im 1/53]
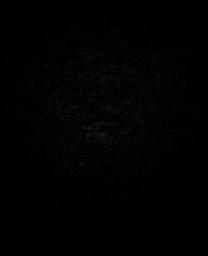
[im 27/53]
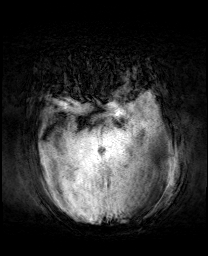
[im 53/53]
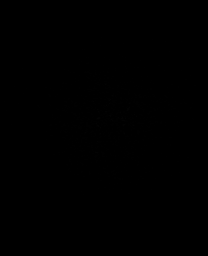

[Series 20: T2 · coronal · 5.0mm · 0.34mm/px · 2 of 29 slices shown (2 of 2)]
[im 1/29]
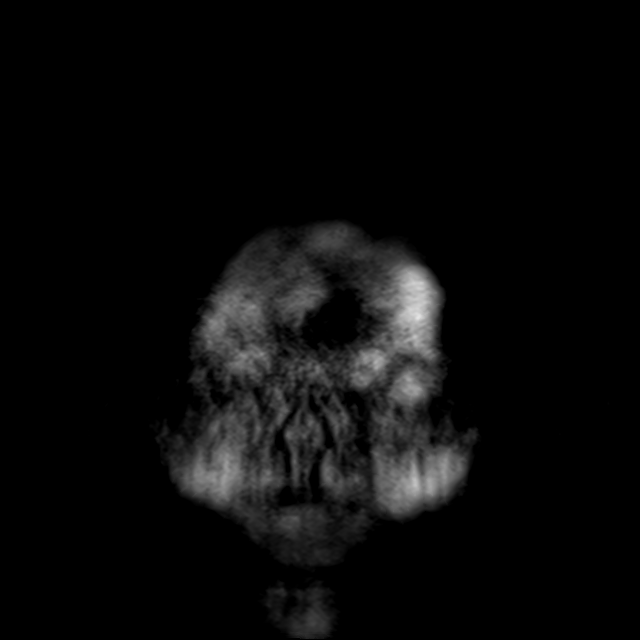
[im 29/29]
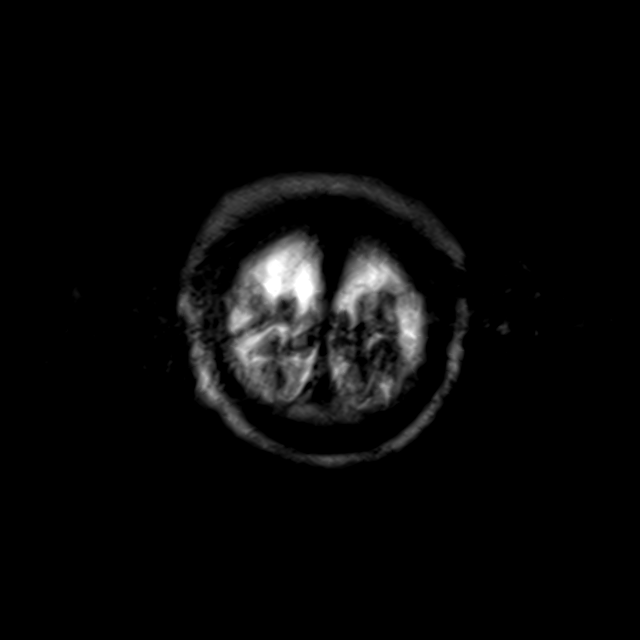

[44 of 48 positions shown; findings below may reference images not displayed]

FINDINGS: Brain: The study suffers from motion degradation. Diffusion imaging
does not show evidence of a large acute or subacute infarction. A
small insult may not be visible given this degree of motion
degradation. No focal lesion is seen affecting the brainstem or
cerebellum. Cerebral hemispheres show generalized atrophy with
chronic encephalomalacia in the right temporal lobe which could be
due to old ischemic infarction or old trauma. There is an old
infarction in the left basal ganglia/external capsule region. There
are mild chronic small-vessel changes of the hemispheric white
matter. No evidence of mass, hemorrhage, hydrocephalus or
extra-axial collection.

Vascular: Major vessels at the base of the brain show flow.

Skull and upper cervical spine: Negative

Sinuses/Orbits: Clear/normal

Other: None
IMPRESSION: Motion degraded examination. No acute or reversible finding
identified. Generalized brain atrophy. Chronic encephalomalacia in
the right temporal lobe that could be due to old ischemic infarction
or trauma. Old infarction in the left basal ganglia/external
capsule. Mild chronic small-vessel change of the hemispheric white
matter.
